# Patient Record
Sex: Female | Born: 1995 | Race: White | Hispanic: No | Marital: Married | Smoking: Never smoker
Health system: Southern US, Community
[De-identification: ages and names within clinical notes are randomized; demographics above are authoritative.]

## PROBLEM LIST (undated history)

## (undated) DIAGNOSIS — J45909 Unspecified asthma, uncomplicated: Secondary | ICD-10-CM

## (undated) DIAGNOSIS — K4090 Unilateral inguinal hernia, without obstruction or gangrene, not specified as recurrent: Secondary | ICD-10-CM

## (undated) HISTORY — PX: WISDOM TOOTH EXTRACTION: SHX21

## (undated) HISTORY — PX: TONSILLECTOMY: SUR1361

## (undated) HISTORY — PX: FRACTURE SURGERY: SHX138

---
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
## 2014-06-02 ENCOUNTER — Ambulatory Visit: Payer: Self-pay | Admitting: Obstetrics and Gynecology

## 2014-06-02 LAB — CBC WITH DIFFERENTIAL/PLATELET
BASOS PCT: 0.1 %
Basophil #: 0 10*3/uL (ref 0.0–0.1)
EOS ABS: 0.1 10*3/uL (ref 0.0–0.7)
Eosinophil %: 1 %
HCT: 40.4 % (ref 35.0–47.0)
HGB: 13.2 g/dL (ref 12.0–16.0)
LYMPHS PCT: 15.8 %
Lymphocyte #: 1.6 10*3/uL (ref 1.0–3.6)
MCH: 28.5 pg (ref 26.0–34.0)
MCHC: 32.6 g/dL (ref 32.0–36.0)
MCV: 88 fL (ref 80–100)
MONO ABS: 0.6 x10 3/mm (ref 0.2–0.9)
MONOS PCT: 6.4 %
Neutrophil #: 7.7 10*3/uL — ABNORMAL HIGH (ref 1.4–6.5)
Neutrophil %: 76.7 %
PLATELETS: 210 10*3/uL (ref 150–440)
RBC: 4.62 10*6/uL (ref 3.80–5.20)
RDW: 13.7 % (ref 11.5–14.5)
WBC: 10 10*3/uL (ref 3.6–11.0)

## 2014-06-05 ENCOUNTER — Inpatient Hospital Stay: Payer: Self-pay | Admitting: Obstetrics and Gynecology

## 2014-06-05 LAB — GC/CHLAMYDIA PROBE AMP

## 2014-06-06 LAB — HEMATOCRIT: HCT: 31.8 % — ABNORMAL LOW (ref 35.0–47.0)

## 2014-09-04 ENCOUNTER — Emergency Department: Payer: Self-pay | Admitting: Emergency Medicine

## 2014-09-28 ENCOUNTER — Emergency Department: Payer: Self-pay | Admitting: Emergency Medicine

## 2014-09-29 ENCOUNTER — Emergency Department: Payer: Self-pay | Admitting: Emergency Medicine

## 2014-09-29 LAB — COMPREHENSIVE METABOLIC PANEL
Albumin: 4 g/dL (ref 3.8–5.6)
Alkaline Phosphatase: 93 U/L
Anion Gap: 8 (ref 7–16)
BUN: 17 mg/dL (ref 7–18)
Bilirubin,Total: 0.4 mg/dL (ref 0.2–1.0)
CHLORIDE: 108 mmol/L — AB (ref 98–107)
Calcium, Total: 8.7 mg/dL — ABNORMAL LOW (ref 9.0–10.7)
Co2: 24 mmol/L (ref 21–32)
Creatinine: 0.71 mg/dL (ref 0.60–1.30)
Glucose: 80 mg/dL (ref 65–99)
Osmolality: 280 (ref 275–301)
POTASSIUM: 4.8 mmol/L (ref 3.5–5.1)
SGOT(AST): 37 U/L — ABNORMAL HIGH (ref 0–26)
SGPT (ALT): 37 U/L
Sodium: 140 mmol/L (ref 136–145)
TOTAL PROTEIN: 7.8 g/dL (ref 6.4–8.6)

## 2014-09-29 LAB — CBC WITH DIFFERENTIAL/PLATELET
BASOS ABS: 0 10*3/uL (ref 0.0–0.1)
Basophil %: 0.2 %
EOS ABS: 0.2 10*3/uL (ref 0.0–0.7)
Eosinophil %: 2.1 %
HCT: 39.7 % (ref 35.0–47.0)
HGB: 13.3 g/dL (ref 12.0–16.0)
LYMPHS ABS: 1.9 10*3/uL (ref 1.0–3.6)
Lymphocyte %: 22.9 %
MCH: 27.9 pg (ref 26.0–34.0)
MCHC: 33.5 g/dL (ref 32.0–36.0)
MCV: 83 fL (ref 80–100)
Monocyte #: 0.8 x10 3/mm (ref 0.2–0.9)
Monocyte %: 9.7 %
NEUTROS ABS: 5.5 10*3/uL (ref 1.4–6.5)
NEUTROS PCT: 65.1 %
Platelet: 231 10*3/uL (ref 150–440)
RBC: 4.76 10*6/uL (ref 3.80–5.20)
RDW: 14.4 % (ref 11.5–14.5)
WBC: 8.4 10*3/uL (ref 3.6–11.0)

## 2014-09-29 LAB — URINALYSIS, COMPLETE
Bilirubin,UR: NEGATIVE
GLUCOSE, UR: NEGATIVE mg/dL (ref 0–75)
Ketone: NEGATIVE
Leukocyte Esterase: NEGATIVE
Nitrite: NEGATIVE
Ph: 6 (ref 4.5–8.0)
RBC,UR: <1 /HPF (ref 0–5)
Specific Gravity: 1.009 (ref 1.003–1.030)

## 2015-03-03 NOTE — Op Note (Signed)
PATIENT NAME:  Vanessa Mullins, Vanessa Mullins MR#:  [id number] DATE OF BIRTH:  [birth year]  DATE OF PROCEDURE:  06/05/2014  PREOPERATIVE DIAGNOSES:  1.  39 weeks estimated gestational age.  2.  Breech presentation.   POSTOPERATIVE DIAGNOSES:  1.  39 weeks estimated gestational age.  2.  Breech presentation.   PROCEDURE: Primary low transverse cesarean section.   ANESTHESIA: Spinal.   INDICATION: This is a 19 year old gravida 1 para zero patient with a known breech presentation, reconfirmed on the day of the procedure. The patient was elected for surgical intervention.   PROCEDURE: After adequate spinal anesthesia, the patient placed in the dorsal supine position with a hip roll under the right side. The patient's abdomen was prepped and draped in normal sterile fashion. The patient did receive 2 grams of IV Ancef prior to commencement of the case.   A Pfannenstiel incision was made 2 fingerbreadths above the symphysis pubis. Sharp dissection was used to identify the fascia. The fascia was opened in the midline and opened in transverse fashion. The superior aspect of the fascia was grasped with Coker clamps and the rectus muscles were dissected free. The inferior aspect of the fascia was grasped with Kocher clamps. Pyramidalis muscle was dissected free. Entry into the peritoneal cavity was accomplished sharply. The vesicouterine peritoneal fold was identified and a bladder flap was created and the bladder was reflected inferiorly.   A low transverse uterine incision was made. Upon entry into the endometrial cavity, clear fluid resulted. The uterine incision was extended with blunt transverse traction. Frank breech presentation was identified.  Legs were delivered and draped with a cloth while the arms were delivered with a sweeping motion to the medial aspect of the infant's thorax and the head was delivered via the Mauriceau-Smellie-Veit maneuver with no difficulty; a somewhat floppy infant. The cord was doubly  clamped, infant passed to neonatal staff who assigned Apgar scores of 9 and 9. Cord blood was obtained. The placenta was manually delivered and the uterus was exteriorized and wiped clean with laparotomy tape.   The cervix was opened with ring forceps and this was passed off the operative field. The uterine incision was closed with 1-chromic suture in a running locking fashion with good approximation and good hemostasis noted. Of note, the uterus was irregularly shaped, most likely due to the prolonged frank presentation with a larger right aspect of the uterus; this is where the head with was antenatally. Fallopian tubes appeared normal. Ovaries appeared normal.   The posterior cul-de-sac was irrigated and suctioned and the uterus was placed back into the abdominal cavity.  The paracolic gutters were wiped clean with laparotomy tape. Uterine incision again appeared hemostatic. The superior aspect of the fascia was grasped with Coker clamps and the On-Q pump catheters were placed from an infraumbilical position to a subfascial position. Interceed was placed on the uterine incision in a T-shaped fashion and the fascia was then closed above the On-Q pump catheters with 0-Vicryl suture. Good hemostasis was noted. Good approximation of tissues.   Subcutaneous tissues were irrigated and Bovied and the skin was reapproximated with the Insorb absorbable sutures with good cosmetic effect. The On-Q pump catheters were secured at the skin level with Dermabond, Steri-Strips and Tegaderm. Each catheter was loaded with 5 mL of 0.5% Marcaine.   ESTIMATED BLOOD LOSS:  500 mL   INTRAOPERATIVE FLUIDS: 1100 mL.  URINE OUTPUT: 150 mL.  The patient tolerated the procedure well and was taken to the recovery room in good  condition.    ____________________________ Suzy Bouchard, MD tjs:lt D: 06/05/2014 10:14:53 ET T: 06/05/2014 10:31:31 ET JOB#: [id number]  cc: Suzy Bouchard, MD, <Dictator> Suzy Bouchard MD ELECTRONICALLY SIGNED 06/05/2014 17:02

## 2016-03-31 ENCOUNTER — Encounter: Payer: Self-pay | Admitting: Emergency Medicine

## 2016-03-31 ENCOUNTER — Emergency Department
Admission: EM | Admit: 2016-03-31 | Discharge: 2016-03-31 | Disposition: A | Discharge status: Home / Self Care | Payer: Medicaid Other | Attending: Emergency Medicine | Admitting: Emergency Medicine

## 2016-03-31 ENCOUNTER — Emergency Department: Payer: Medicaid Other

## 2016-03-31 DIAGNOSIS — J45909 Unspecified asthma, uncomplicated: Secondary | ICD-10-CM | POA: Insufficient documentation

## 2016-03-31 DIAGNOSIS — R112 Nausea with vomiting, unspecified: Secondary | ICD-10-CM | POA: Insufficient documentation

## 2016-03-31 DIAGNOSIS — R1032 Left lower quadrant pain: Secondary | ICD-10-CM | POA: Diagnosis present

## 2016-03-31 DIAGNOSIS — R1030 Lower abdominal pain, unspecified: Secondary | ICD-10-CM

## 2016-03-31 HISTORY — DX: Unilateral inguinal hernia, without obstruction or gangrene, not specified as recurrent: K40.90

## 2016-03-31 HISTORY — DX: Unspecified asthma, uncomplicated: J45.909

## 2016-03-31 LAB — URINALYSIS COMPLETE WITH MICROSCOPIC (ARMC ONLY)
BACTERIA UA: NONE SEEN
BILIRUBIN URINE: NEGATIVE
GLUCOSE, UA: NEGATIVE mg/dL
HGB URINE DIPSTICK: NEGATIVE
Ketones, ur: NEGATIVE mg/dL
LEUKOCYTES UA: NEGATIVE
Nitrite: NEGATIVE
Protein, ur: NEGATIVE mg/dL
Specific Gravity, Urine: 1.012 (ref 1.005–1.030)
pH: 6 (ref 5.0–8.0)

## 2016-03-31 LAB — COMPREHENSIVE METABOLIC PANEL
ALBUMIN: 4.6 g/dL (ref 3.5–5.0)
ALT: 25 U/L (ref 14–54)
AST: 26 U/L (ref 15–41)
Alkaline Phosphatase: 74 U/L (ref 38–126)
Anion gap: 6 (ref 5–15)
BILIRUBIN TOTAL: 0.5 mg/dL (ref 0.3–1.2)
BUN: 13 mg/dL (ref 6–20)
CHLORIDE: 103 mmol/L (ref 101–111)
CO2: 27 mmol/L (ref 22–32)
CREATININE: 0.67 mg/dL (ref 0.44–1.00)
Calcium: 9.1 mg/dL (ref 8.9–10.3)
GFR calc Af Amer: >60 mL/min (ref >60)
GLUCOSE: 78 mg/dL (ref 65–99)
Potassium: 3.9 mmol/L (ref 3.5–5.1)
Sodium: 136 mmol/L (ref 135–145)
Total Protein: 8.1 g/dL (ref 6.5–8.1)

## 2016-03-31 LAB — POCT PREGNANCY, URINE: PREG TEST UR: NEGATIVE

## 2016-03-31 LAB — CBC
HEMATOCRIT: 40 % (ref 35.0–47.0)
Hemoglobin: 13.9 g/dL (ref 12.0–16.0)
MCH: 28.5 pg (ref 26.0–34.0)
MCHC: 34.7 g/dL (ref 32.0–36.0)
MCV: 82.3 fL (ref 80.0–100.0)
PLATELETS: 239 10*3/uL (ref 150–440)
RBC: 4.86 MIL/uL (ref 3.80–5.20)
RDW: 13.2 % (ref 11.5–14.5)
WBC: 9.1 10*3/uL (ref 3.6–11.0)

## 2016-03-31 MED ORDER — ONDANSETRON HCL 4 MG/2ML IJ SOLN
INTRAMUSCULAR | Status: AC
  Administered 2016-03-31: 4 mg via INTRAVENOUS
  Filled 2016-03-31: qty 2

## 2016-03-31 MED ORDER — IOPAMIDOL (ISOVUE-300) INJECTION 61%
100.0000 mL | Freq: Once | INTRAVENOUS | Status: AC | PRN
  Administered 2016-03-31: 100 mL via INTRAVENOUS
  Filled 2016-03-31: qty 100

## 2016-03-31 MED ORDER — DIATRIZOATE MEGLUMINE & SODIUM 66-10 % PO SOLN
15.0000 mL | Freq: Once | ORAL | Status: AC
  Administered 2016-03-31: 15 mL via ORAL

## 2016-03-31 MED ORDER — MORPHINE SULFATE (PF) 4 MG/ML IV SOLN
4.0000 mg | Freq: Once | INTRAVENOUS | Status: AC
Start: 2016-03-31 — End: 2016-03-31
  Administered 2016-03-31: 4 mg via INTRAVENOUS

## 2016-03-31 MED ORDER — ONDANSETRON HCL 4 MG/2ML IJ SOLN
4.0000 mg | Freq: Once | INTRAMUSCULAR | Status: AC
  Administered 2016-03-31: 4 mg via INTRAVENOUS

## 2016-03-31 MED ORDER — MORPHINE SULFATE (PF) 4 MG/ML IV SOLN
INTRAVENOUS | Status: AC
  Administered 2016-03-31: 4 mg via INTRAVENOUS
  Filled 2016-03-31: qty 1

## 2016-03-31 MED ORDER — TRAMADOL HCL 50 MG PO TABS: 50.0000 mg | ORAL_TABLET | Freq: Four times a day (QID) | ORAL | Status: AC | PRN

## 2016-03-31 NOTE — Discharge Instructions (Signed)
You have been seen in the emergency department today for lower abdominal pain. Your workup is consistent with scar tissue in your cesarean section incision area. Please follow-up with your OB/GYN for further evaluation. Please take your pain medication as needed, as prescribed. Return to the emergency department for any worsening pain, or any other symptom personally concerning to yourself.   Abdominal Pain, Adult Many things can cause abdominal pain. Usually, abdominal pain is not caused by a disease and will improve without treatment. It can often be observed and treated at home. Your health care provider will do a physical exam and possibly order blood tests and X-rays to help determine the seriousness of your pain. However, in many cases, more time must pass before a clear cause of the pain can be found. Before that point, your health care provider may not know if you need more testing or further treatment. HOME CARE INSTRUCTIONS Monitor your abdominal pain for any changes. The following actions may help to alleviate any discomfort you are experiencing:  Only take over-the-counter or prescription medicines as directed by your health care provider.  Do not take laxatives unless directed to do so by your health care provider.  Try a clear liquid diet (broth, tea, or water) as directed by your health care provider. Slowly move to a bland diet as tolerated. SEEK MEDICAL CARE IF:  You have unexplained abdominal pain.  You have abdominal pain associated with nausea or diarrhea.  You have pain when you urinate or have a bowel movement.  You experience abdominal pain that wakes you in the night.  You have abdominal pain that is worsened or improved by eating food.  You have abdominal pain that is worsened with eating fatty foods.  You have a fever. SEEK IMMEDIATE MEDICAL CARE IF:  Your pain does not go away within 2 hours.  You keep throwing up (vomiting).  Your pain is felt only in  portions of the abdomen, such as the right side or the left lower portion of the abdomen.  You pass bloody or black tarry stools. MAKE SURE YOU:  Understand these instructions.  Will watch your condition.  Will get help right away if you are not doing well or get worse.   This information is not intended to replace advice given to you by your health care provider. Make sure you discuss any questions you have with your health care provider.   Document Released: 08/06/2005 Document Revised: 07/18/2015 Document Reviewed: 07/06/2013 Elsevier Interactive Patient Education [employer]2016 [employer]

## 2016-03-31 NOTE — ED Notes (Signed)
MD made aware pt in pain and requesting pain meds.

## 2016-03-31 NOTE — ED Notes (Signed)
Patient transported to CT 

## 2016-03-31 NOTE — ED Notes (Signed)
Lower left abdominal pain increasing today. Has history of being told has inguinal hernia on that side. No hard lump felt.

## 2016-03-31 NOTE — ED Notes (Signed)

## 2016-03-31 NOTE — ED Provider Notes (Signed)
[facility] Emergency Department Provider Note  Time seen: 7:32 PM  I have reviewed the triage vital signs and the nursing notes.   HISTORY  Chief Complaint Abdominal Pain    HPI Vanessa Mullins is a 20 y.o. female with no past medical history who presents the emergency department left lower quadrant abdominal pain. According to the patient for the past 1.5 months she's been experiencing fairly constant left lower quadrant abdominal pain. She states it is worse over the past several days and when she is having pain lifting HER-40-year-old. States the pain is worse with movement especially lifting movements. Denies any dysuria, vaginal discharge or bleeding. Patient has been told she may have an inguinal hernia on that side, but states she has never had imaging to support this. States normal bowel movements including yesterday. Denies diarrhea or constipation. Patient describes the abdominal pain as mild constant dull pain with occasional moderate to severe sharp pains especially with movement and lifting.     Past Medical History  Diagnosis Date  . Inguinal hernia     There are no active problems to display for this patient.   Past Surgical History  Procedure Laterality Date  . Cesarean section      No current outpatient prescriptions on file.  Allergies Review of patient's allergies indicates no known allergies.  No family history on file.  Social History Social History  Substance Use Topics  . Smoking status: Never Smoker   . Smokeless tobacco: None  . Alcohol Use: No    Review of Systems Constitutional: Negative for fever. Cardiovascular: Negative for chest pain. Respiratory: Negative for shortness of breath. Gastrointestinal: Left lower quadrant abdominal pain. Positive for nausea and vomiting. Negative for diarrhea. Genitourinary: Negative for dysuria. Negative for vaginal bleeding or discharge. Musculoskeletal: Negative for back  pain. Neurological: Negative for headache 10-point ROS otherwise negative.  ____________________________________________   PHYSICAL EXAM:  VITAL SIGNS: ED Triage Vitals  Enc Vitals Group     BP 03/31/16 1703 122/75 mmHg     Pulse Rate 03/31/16 1703 79     Resp 03/31/16 1703 20     Temp 03/31/16 1703 98 F (36.7 C)     Temp Source 03/31/16 1703 Oral     SpO2 03/31/16 1703 99 %     Weight 03/31/16 1703 190 lb (86.183 kg)     Height 03/31/16 1703 _0  (1.651 m)     Head Cir --      Peak Flow --      Pain Score 03/31/16 1704 9     Pain Loc --      Pain Edu? --      Excl. in [place]? --     Constitutional: Alert and oriented. Well appearing and in no distress. Eyes: Normal exam ENT   Head: Normocephalic and atraumatic   Mouth/Throat: Mucous membranes are moist. Cardiovascular: Normal rate, regular rhythm. No murmur Respiratory: Normal respiratory effort without tachypnea nor retractions. Breath sounds are clear  Gastrointestinal: Soft, minimal left lower quadrant abdominal tenderness to palpation. No rebound or guarding. No distention. No reaction to deep palpation. No hernia palpated. Musculoskeletal: Nontender with normal range of motion in all extremities.  Neurologic:  Normal speech and language. No gross focal neurologic deficits  Skin:  Skin is warm, dry and intact.  Psychiatric: Mood and affect are normal.  ____________________________________________   RADIOLOGY  Ultrasound negative CT negative ____________________________________________    INITIAL IMPRESSION / ASSESSMENT AND PLAN / ED COURSE  Pertinent labs & imaging results that were available during my care of the patient were reviewed by me and considered in my medical decision making (see chart for details).  Overall very well-appearing patient who presents with left lower quadrant abdominal pain for the past 1.5 months worse over the past few days. Patient has a very benign abdominal exam with  minimal left lower quadrant abdominal tenderness, no mass palpated, no rebound or guarding. Labs are largely within normal limits. We'll proceed with a pelvic ultrasound to further evaluate.  Ultrasound negative. Labs are within normal limits. Patient states she continues to have pain in this area and really feels that it is a hernia causing her pain. The proceed with a CT abdomen/pelvis to further evaluate. I discussed the risk and benefits of CT, patient is agreeable.  CT is negative. I discussed with the radiologist given the patient's pain location, he states there could be a small foci approximately 7 mm of scar tissue on her C-section scar, this does correlate to the area that the patient is tender. We will discharge the patient on a short course of Ultram, and have her follow up with her gynecologist at [facility] clinic. Patient agreeable.  ____________________________________________   FINAL CLINICAL IMPRESSION(S) / ED DIAGNOSES  Left lower quadrant abdominal pain   Harvest Dark, MD 03/31/16 2201

## 2017-01-30 DIAGNOSIS — N6310 Unspecified lump in the right breast, unspecified quadrant: Secondary | ICD-10-CM | POA: Diagnosis not present

## 2017-02-02 ENCOUNTER — Other Ambulatory Visit: Payer: Self-pay | Admitting: Family Medicine

## 2017-02-02 DIAGNOSIS — N6310 Unspecified lump in the right breast, unspecified quadrant: Secondary | ICD-10-CM

## 2017-02-04 ENCOUNTER — Ambulatory Visit
Admission: RE | Admit: 2017-02-04 | Discharge: 2017-02-04 | Disposition: A | Discharge status: Home / Self Care | Payer: 59 | Source: Ambulatory Visit | Attending: Family Medicine | Admitting: Family Medicine

## 2017-02-04 DIAGNOSIS — R928 Other abnormal and inconclusive findings on diagnostic imaging of breast: Secondary | ICD-10-CM | POA: Insufficient documentation

## 2017-02-04 DIAGNOSIS — N6489 Other specified disorders of breast: Secondary | ICD-10-CM | POA: Diagnosis not present

## 2017-02-04 DIAGNOSIS — N6310 Unspecified lump in the right breast, unspecified quadrant: Secondary | ICD-10-CM

## 2017-02-25 DIAGNOSIS — R234 Changes in skin texture: Secondary | ICD-10-CM | POA: Diagnosis not present

## 2017-02-25 DIAGNOSIS — J309 Allergic rhinitis, unspecified: Secondary | ICD-10-CM | POA: Diagnosis not present

## 2017-03-12 DIAGNOSIS — Z124 Encounter for screening for malignant neoplasm of cervix: Secondary | ICD-10-CM | POA: Diagnosis not present

## 2017-03-12 DIAGNOSIS — Z0000 Encounter for general adult medical examination without abnormal findings: Secondary | ICD-10-CM | POA: Diagnosis not present

## 2017-04-10 DIAGNOSIS — Z79899 Other long term (current) drug therapy: Secondary | ICD-10-CM | POA: Diagnosis not present

## 2017-07-01 DIAGNOSIS — B9689 Other specified bacterial agents as the cause of diseases classified elsewhere: Secondary | ICD-10-CM | POA: Diagnosis not present

## 2017-07-01 DIAGNOSIS — N760 Acute vaginitis: Secondary | ICD-10-CM | POA: Diagnosis not present

## 2017-07-01 DIAGNOSIS — L298 Other pruritus: Secondary | ICD-10-CM | POA: Diagnosis not present

## 2017-08-10 DIAGNOSIS — G589 Mononeuropathy, unspecified: Secondary | ICD-10-CM | POA: Diagnosis not present

## 2017-08-14 DIAGNOSIS — R197 Diarrhea, unspecified: Secondary | ICD-10-CM | POA: Diagnosis not present

## 2017-08-14 DIAGNOSIS — R1084 Generalized abdominal pain: Secondary | ICD-10-CM | POA: Diagnosis not present

## 2017-08-14 DIAGNOSIS — R112 Nausea with vomiting, unspecified: Secondary | ICD-10-CM | POA: Diagnosis not present

## 2017-08-27 DIAGNOSIS — R5383 Other fatigue: Secondary | ICD-10-CM | POA: Diagnosis not present

## 2017-08-27 DIAGNOSIS — N6452 Nipple discharge: Secondary | ICD-10-CM | POA: Diagnosis not present

## 2017-08-27 DIAGNOSIS — N63 Unspecified lump in unspecified breast: Secondary | ICD-10-CM | POA: Diagnosis not present

## 2017-08-31 ENCOUNTER — Other Ambulatory Visit: Payer: Self-pay | Admitting: Student

## 2017-08-31 DIAGNOSIS — E221 Hyperprolactinemia: Secondary | ICD-10-CM

## 2017-09-04 ENCOUNTER — Encounter: Payer: Self-pay | Admitting: Radiology

## 2017-09-04 ENCOUNTER — Ambulatory Visit
Admission: RE | Admit: 2017-09-04 | Discharge: 2017-09-04 | Disposition: A | Discharge status: Home / Self Care | Payer: Commercial Managed Care - HMO | Source: Ambulatory Visit | Attending: Student | Admitting: Student

## 2017-09-04 DIAGNOSIS — E221 Hyperprolactinemia: Secondary | ICD-10-CM | POA: Diagnosis not present

## 2017-09-04 DIAGNOSIS — R51 Headache: Secondary | ICD-10-CM | POA: Diagnosis not present

## 2017-09-04 DIAGNOSIS — R42 Dizziness and giddiness: Secondary | ICD-10-CM | POA: Diagnosis not present

## 2017-09-04 MED ORDER — GADOBENATE DIMEGLUMINE 529 MG/ML IV SOLN
10.0000 mL | Freq: Once | INTRAVENOUS | Status: AC | PRN
  Administered 2017-09-04: 9 mL via INTRAVENOUS

## 2017-09-09 DIAGNOSIS — G589 Mononeuropathy, unspecified: Secondary | ICD-10-CM | POA: Diagnosis not present

## 2017-09-11 DIAGNOSIS — N643 Galactorrhea not associated with childbirth: Secondary | ICD-10-CM | POA: Diagnosis not present

## 2017-10-13 DIAGNOSIS — E221 Hyperprolactinemia: Secondary | ICD-10-CM | POA: Diagnosis not present

## 2017-10-13 DIAGNOSIS — R6889 Other general symptoms and signs: Secondary | ICD-10-CM | POA: Diagnosis not present

## 2017-10-22 ENCOUNTER — Ambulatory Visit: Payer: 59 | Admitting: Dietician

## 2017-10-29 ENCOUNTER — Encounter: Payer: Self-pay | Admitting: Dietician

## 2017-12-03 DIAGNOSIS — G589 Mononeuropathy, unspecified: Secondary | ICD-10-CM | POA: Diagnosis not present

## 2017-12-06 ENCOUNTER — Emergency Department
Admission: EM | Admit: 2017-12-06 | Discharge: 2017-12-06 | Disposition: A | Discharge status: Home / Self Care | Payer: 59 | Attending: Emergency Medicine | Admitting: Emergency Medicine

## 2017-12-06 ENCOUNTER — Encounter: Payer: Self-pay | Admitting: Emergency Medicine

## 2017-12-06 ENCOUNTER — Emergency Department: Payer: 59

## 2017-12-06 ENCOUNTER — Other Ambulatory Visit: Payer: Self-pay

## 2017-12-06 DIAGNOSIS — J45909 Unspecified asthma, uncomplicated: Secondary | ICD-10-CM | POA: Insufficient documentation

## 2017-12-06 DIAGNOSIS — S59801A Other specified injuries of right elbow, initial encounter: Secondary | ICD-10-CM | POA: Diagnosis not present

## 2017-12-06 DIAGNOSIS — M7989 Other specified soft tissue disorders: Secondary | ICD-10-CM | POA: Diagnosis not present

## 2017-12-06 DIAGNOSIS — S53401A Unspecified sprain of right elbow, initial encounter: Secondary | ICD-10-CM | POA: Insufficient documentation

## 2017-12-06 DIAGNOSIS — Y929 Unspecified place or not applicable: Secondary | ICD-10-CM | POA: Insufficient documentation

## 2017-12-06 DIAGNOSIS — S59901A Unspecified injury of right elbow, initial encounter: Secondary | ICD-10-CM | POA: Diagnosis not present

## 2017-12-06 DIAGNOSIS — M25521 Pain in right elbow: Secondary | ICD-10-CM | POA: Diagnosis not present

## 2017-12-06 DIAGNOSIS — Y999 Unspecified external cause status: Secondary | ICD-10-CM | POA: Insufficient documentation

## 2017-12-06 DIAGNOSIS — Y9389 Activity, other specified: Secondary | ICD-10-CM | POA: Insufficient documentation

## 2017-12-06 DIAGNOSIS — W06XXXA Fall from bed, initial encounter: Secondary | ICD-10-CM | POA: Diagnosis not present

## 2017-12-06 MED ORDER — HYDROCODONE-ACETAMINOPHEN 5-325 MG PO TABS: 1.0000 | ORAL_TABLET | ORAL | 0 refills | Status: DC | PRN

## 2017-12-06 MED ORDER — HYDROCODONE-ACETAMINOPHEN 5-325 MG PO TABS
1.0000 | ORAL_TABLET | Freq: Once | ORAL | Status: AC
  Administered 2017-12-06: 1 via ORAL
  Filled 2017-12-06: qty 1

## 2017-12-06 NOTE — ED Notes (Signed)
Pt states she felt a popping sensation in elbow when she fell out of bed landed on her right arm. Some swelling noted just below elbow. Pt currently applying ice to the affected area. Strong radial pulse with a cap refill < 3. When extending and bending elbow pt states she feels a pulling sensation above the elbow causing discomfort.

## 2017-12-06 NOTE — ED Triage Notes (Signed)
Pt says she fell out of bed tonight and landed wrong on her right arm; c/o pain to right elbow, forearm; swelling present just below elbow; too painful to completely extend arm; strong radial pulse;

## 2017-12-06 NOTE — ED Notes (Signed)
Right arm sling placed on pt

## 2017-12-06 NOTE — ED Provider Notes (Signed)
[facility] Emergency Department Provider Note  Time seen: 3:54 AM  I have reviewed the triage vital signs and the nursing notes.   HISTORY  Chief Complaint Fall    HPI Vanessa Mullins is a 22 y.o. female with a past medical history of asthma presents to the emergency department with right elbow pain.  According to the patient she was getting out of bed to use the restroom states she is very tired and she tripped and fell.  Does not recall exactly how she landed on the arm but is having significant discomfort in her right elbow with some mild swelling.  Rates her pain as moderate aching pain, worse with movement of the elbow.  No other injuries.  Did not hit head or pass out.  Negative review of systems.   Past Medical History:  Diagnosis Date  . Asthma   . Inguinal hernia     There are no active problems to display for this patient.   Past Surgical History:  Procedure Laterality Date  . CESAREAN SECTION      Prior to Admission medications   Medication Sig Start Date End Date Taking? Authorizing Provider  phentermine 37.5 MG capsule Take 37.5 mg by mouth every morning.   Yes [provider]    Allergies  Allergen Reactions  . Amoxicillin Rash    History reviewed. No pertinent family history.  Social History Social History   Tobacco Use  . Smoking status: Never Smoker  . Smokeless tobacco: Never Used  Substance Use Topics  . Alcohol use: No  . Drug use: No    Review of Systems Constitutional: Negative for loss of consciousness. Cardiovascular: Negative for chest pain. Respiratory: Negative for shortness of breath. Gastrointestinal: Negative for abdominal pain Musculoskeletal: Right elbow pain and swelling Skin: Negative for abrasions or lacerations All other ROS negative  ____________________________________________   PHYSICAL EXAM:  VITAL SIGNS: ED Triage Vitals  Enc Vitals Group     BP 12/06/17 0113 122/81     Pulse Rate 12/06/17 0113 96     Resp 12/06/17 0113 18     Temp 12/06/17 0113 (!) 97.5 F (36.4 C)     Temp Source 12/06/17 0113 Oral     SpO2 12/06/17 0113 97 %     Weight 12/06/17 0115 220 lb (99.8 kg)     Height 12/06/17 0115 5\' 5"  (1.651 m)     Head Circumference --      Peak Flow --      Pain Score 12/06/17 0114 8     Pain Loc --      Pain Edu? --      Excl. in [place]? --    Constitutional: Alert and oriented. Well appearing and in no distress. Eyes: Normal exam ENT   Head: Normocephalic and atraumatic.   Mouth/Throat: Mucous membranes are moist. Cardiovascular: Normal rate, regular rhythm. No murmur Respiratory: Normal respiratory effort without tachypnea nor retractions. Breath sounds are clear  Gastrointestinal: Soft and nontender. No distention.   Musculoskeletal: Moderate tenderness of the right elbow.  Moderate discomfort with range of motion.  Neurovascular intact distally.  Good range of motion of the shoulder and the wrist.  No ecchymosis noted, no abrasions or lacerations.  Mild swelling about the elbow. Neurologic:  Normal speech and language. No gross focal neurologic deficits  Skin:  Skin is warm, dry and intact.  Psychiatric: Mood and affect are normal.   ____________________________________________    EKG  X-ray negative  INITIAL IMPRESSION / ASSESSMENT AND PLAN / ED COURSE  Pertinent labs & imaging results that were available during my care of the patient were reviewed by me and considered in my medical decision making (see chart for details).  Patient presents the emergency department for right elbow pain after a fall.  Differential would include contusion, sprain, fracture, dislocation.  Mild to moderate tenderness on exam, good range of motion but with discomfort.  Neurovascular intact distally.  X-ray is negative.  No apparent fracture or dislocation.  Overall exam most consistent with elbow sprain.  Will place in the sling for the next 3 days.   Discussed with patient if she continues to have discomfort after 3 days she is to follow-up with orthopedics in 1 week for recheck/reevaluation.  Otherwise she is to remove the sling after 3 days.  Patient agreeable to plan.  We will discharge with a short course of pain medication.  ____________________________________________   FINAL CLINICAL IMPRESSION(S) / ED DIAGNOSES  Right elbow sprain    Minna Antis, MD 12/06/17 [phone]

## 2018-04-02 IMAGING — CR DG ELBOW COMPLETE 3+V*R*
5 series · 5 of 5 positions shown · non-contrast
Comparison: Right forearm 06/13/2006

CLINICAL DATA: Patient fell out of bed tonight, landing on the
right arm. Pain in the right elbow and forearm. Swelling.

EXAM:
RIGHT ELBOW - COMPLETE 3+ VIEW

[elbow ap]
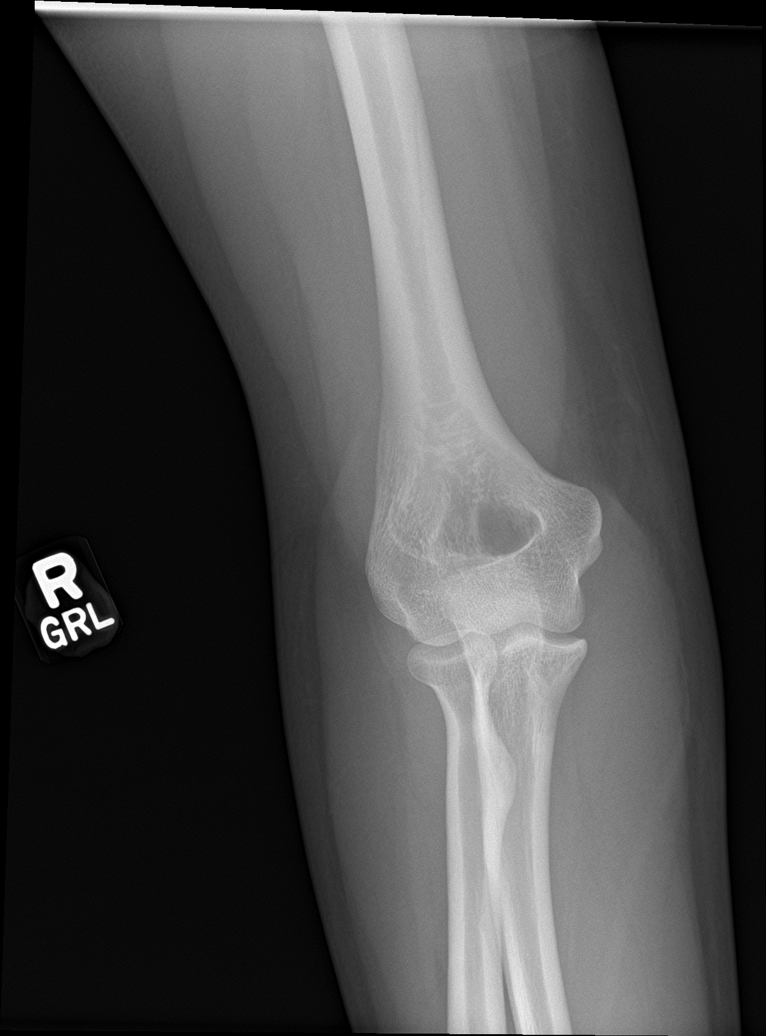

[elbow obl (1 of 2)]
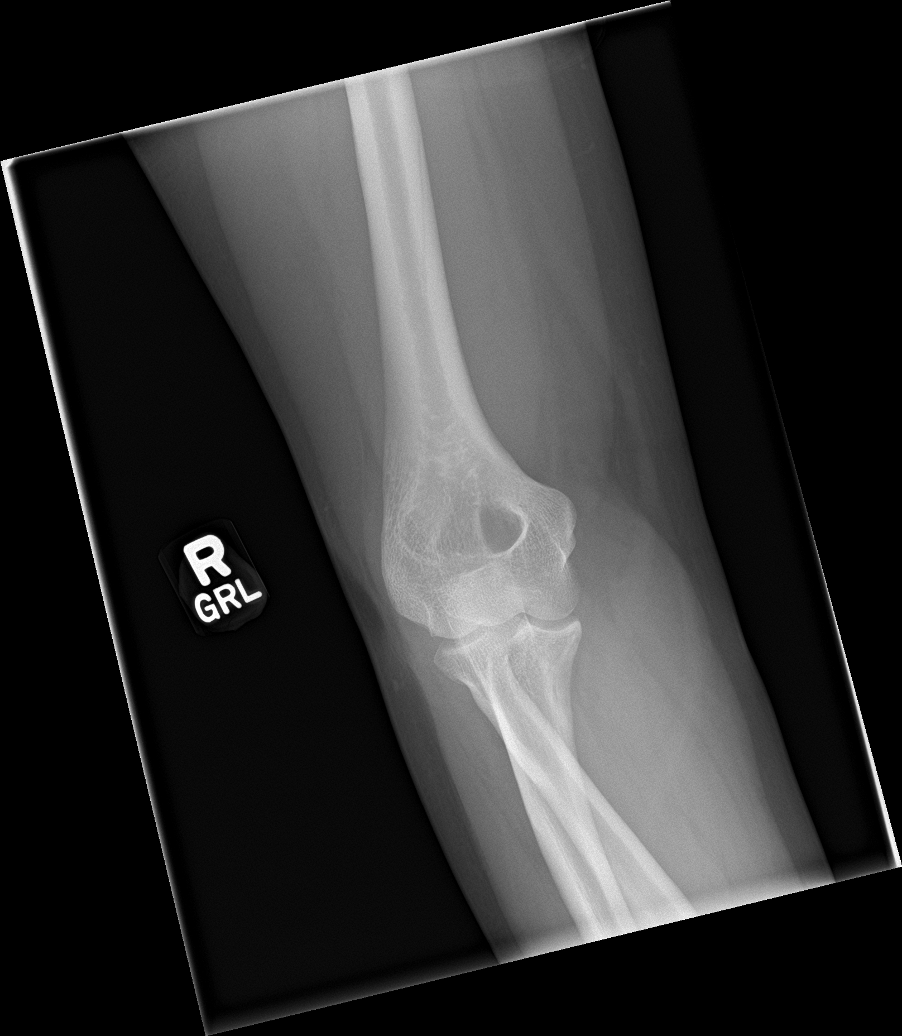

[elbow obl (2 of 2)]
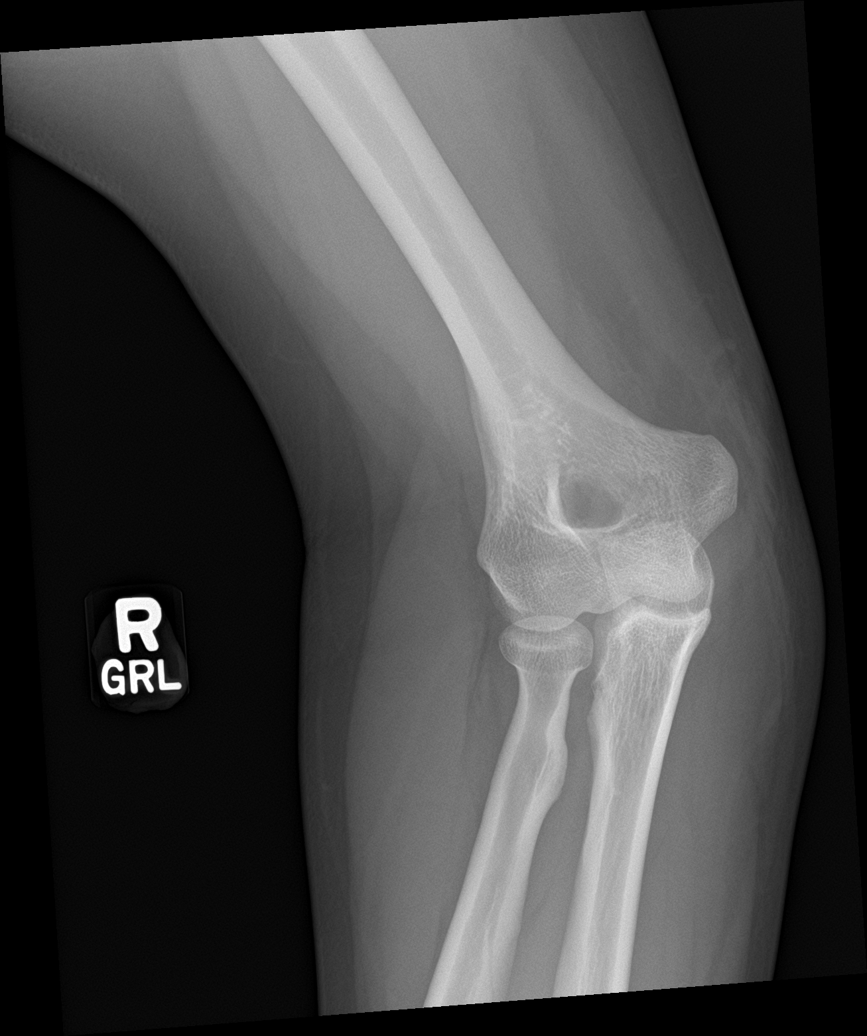

[elbow lat (1 of 2)]
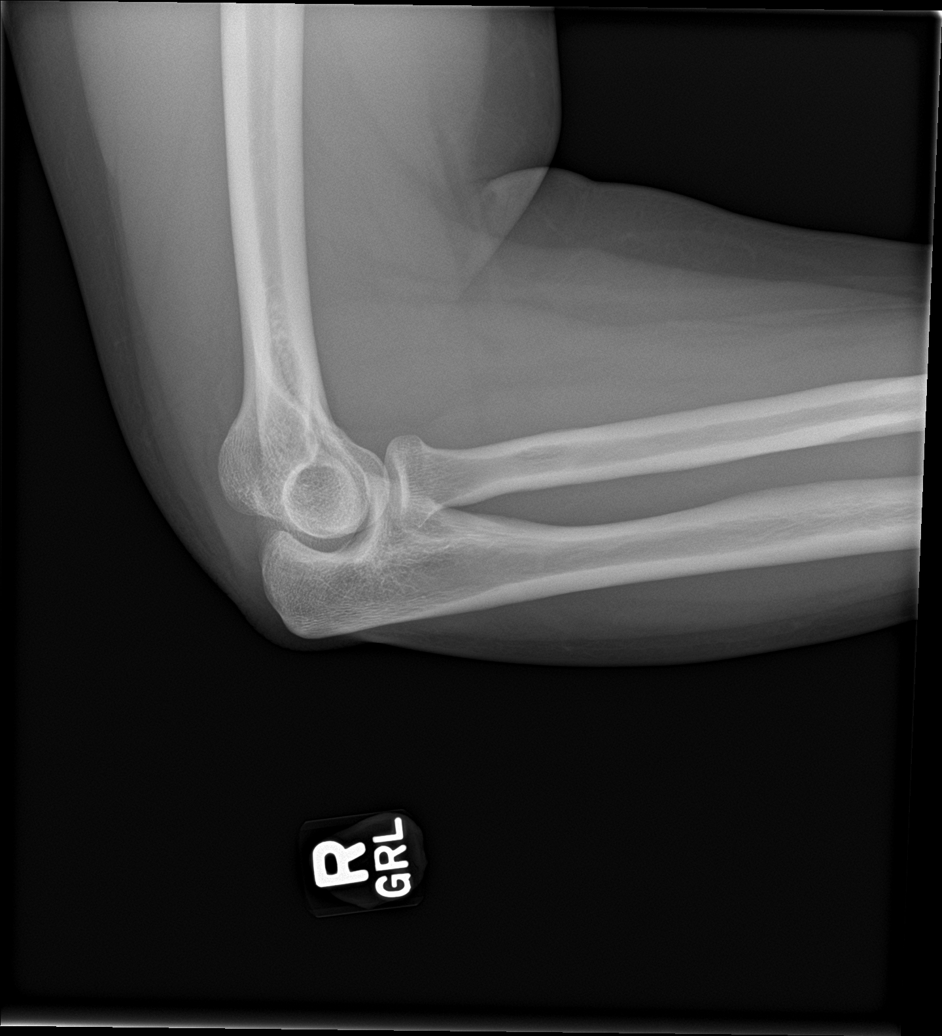

[elbow lat (2 of 2)]
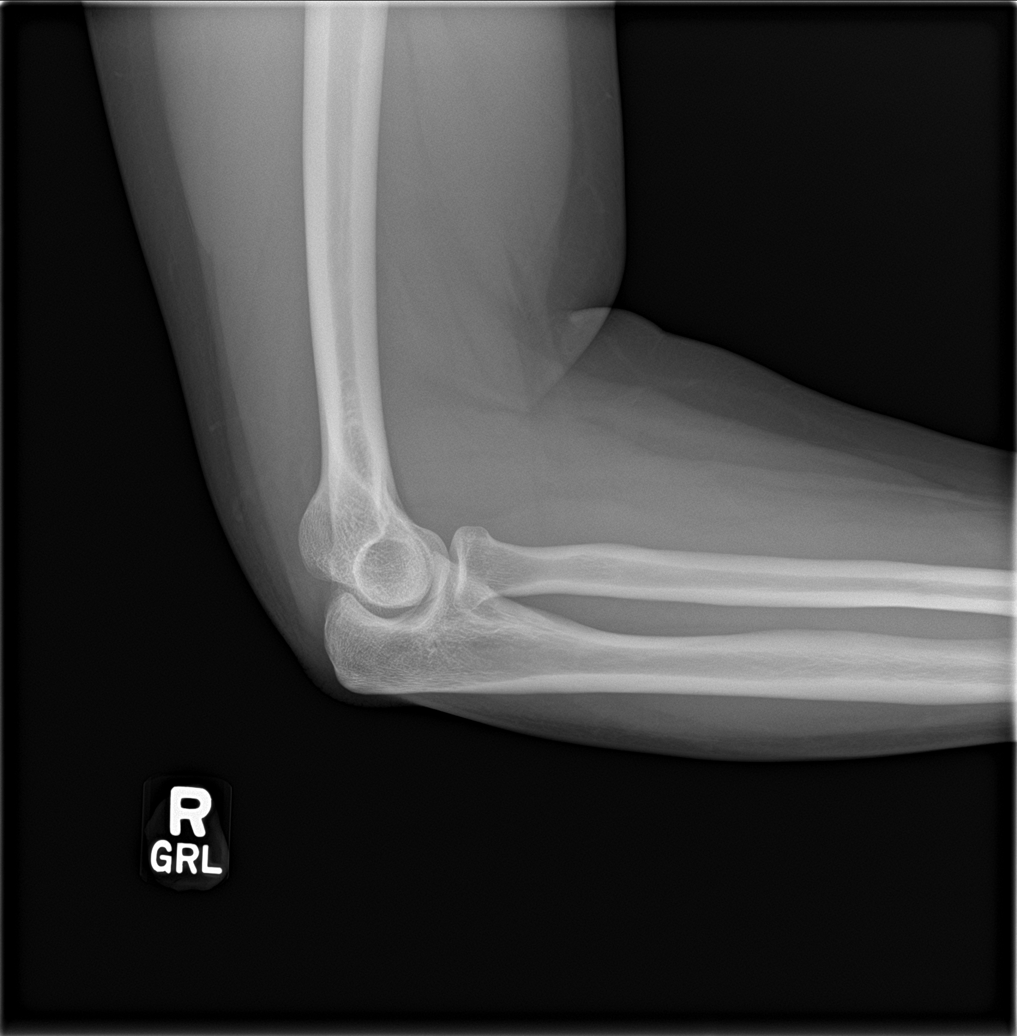

[5 of 5 positions shown; findings below may reference images not displayed]

FINDINGS: There is no evidence of fracture, dislocation, or joint effusion.
There is no evidence of arthropathy or other focal bone abnormality.
Soft tissues are unremarkable.
IMPRESSION: Negative.

## 2018-05-20 DIAGNOSIS — L719 Rosacea, unspecified: Secondary | ICD-10-CM | POA: Diagnosis not present

## 2018-05-20 DIAGNOSIS — L905 Scar conditions and fibrosis of skin: Secondary | ICD-10-CM | POA: Diagnosis not present

## 2018-05-20 DIAGNOSIS — L710 Perioral dermatitis: Secondary | ICD-10-CM | POA: Diagnosis not present

## 2018-05-26 DIAGNOSIS — R5383 Other fatigue: Secondary | ICD-10-CM | POA: Diagnosis not present

## 2018-05-26 DIAGNOSIS — R590 Localized enlarged lymph nodes: Secondary | ICD-10-CM | POA: Diagnosis not present

## 2018-08-09 DIAGNOSIS — R109 Unspecified abdominal pain: Secondary | ICD-10-CM | POA: Diagnosis not present

## 2018-08-09 DIAGNOSIS — N63 Unspecified lump in unspecified breast: Secondary | ICD-10-CM | POA: Diagnosis not present

## 2018-08-09 DIAGNOSIS — N898 Other specified noninflammatory disorders of vagina: Secondary | ICD-10-CM | POA: Diagnosis not present

## 2018-09-14 DIAGNOSIS — N6452 Nipple discharge: Secondary | ICD-10-CM | POA: Diagnosis not present

## 2018-09-14 DIAGNOSIS — Z23 Encounter for immunization: Secondary | ICD-10-CM | POA: Diagnosis not present

## 2020-05-07 ENCOUNTER — Telehealth: Payer: Self-pay | Admitting: Certified Nurse Midwife

## 2020-05-07 NOTE — Telephone Encounter (Signed)
Called patient to let her know that I was able to get her scheduled with one of our midwives. Called patient to set up appointment was unable to reach patient., left message for her to call the office.

## 2020-05-21 ENCOUNTER — Encounter: Payer: Self-pay | Admitting: Certified Nurse Midwife

## 2020-05-21 ENCOUNTER — Other Ambulatory Visit: Payer: Self-pay

## 2020-05-21 ENCOUNTER — Ambulatory Visit: Payer: 59 | Admitting: Certified Nurse Midwife

## 2020-05-21 VITALS — BP 115/80 | HR 86 | Ht 65.0 in | Wt 227.6 lb

## 2020-05-21 DIAGNOSIS — O26899 Other specified pregnancy related conditions, unspecified trimester: Secondary | ICD-10-CM | POA: Diagnosis not present

## 2020-05-21 DIAGNOSIS — Z3201 Encounter for pregnancy test, result positive: Secondary | ICD-10-CM | POA: Diagnosis not present

## 2020-05-21 DIAGNOSIS — R109 Unspecified abdominal pain: Secondary | ICD-10-CM

## 2020-05-21 DIAGNOSIS — O34219 Maternal care for unspecified type scar from previous cesarean delivery: Secondary | ICD-10-CM

## 2020-05-21 DIAGNOSIS — N926 Irregular menstruation, unspecified: Secondary | ICD-10-CM

## 2020-05-21 LAB — POCT URINE PREGNANCY: Preg Test, Ur: POSITIVE — AB

## 2020-05-21 NOTE — Patient Instructions (Addendum)
Eating Plan for Pregnant Women While you are pregnant, your body requires additional nutrition to help support your growing baby. You also have a higher need for some vitamins and minerals, such as folic acid, calcium, iron, and vitamin D. Eating a healthy, well-balanced diet is very important for your health and your baby's health. Your need for extra calories varies for the three 80-monthsegments of your pregnancy (trimesters). For most women, it is recommended to consume:  150 extra calories a day during the first trimester.  300 extra calories a day during the second trimester.  300 extra calories a day during the third trimester. What are tips for following this plan?   Do not try to lose weight or go on a diet during pregnancy.  Limit your overall intake of foods that have "empty calories." These are foods that have little nutritional value, such as sweets, desserts, candies, and sugar-sweetened beverages.  Eat a variety of foods (especially fruits and vegetables) to get a full range of vitamins and minerals.  Take a prenatal vitamin to help meet your additional vitamin and mineral needs during pregnancy, specifically for folic acid, iron, calcium, and vitamin D.  Remember to stay active. Ask your health care provider what types of exercise and activities are safe for you.  Practice good food safety and cleanliness. Wash your hands before you eat and after you prepare raw meat. Wash all fruits and vegetables well before peeling or eating. Taking these actions can help to prevent food-borne illnesses that can be very dangerous to your baby, such as listeriosis. Ask your health care provider for more information about listeriosis. What does 150 extra calories look like? Healthy options that provide 150 extra calories each day could be any of the following:  6-8 oz (170-230 g) of plain low-fat yogurt with  cup of berries.  1 apple with 2 teaspoons (11 g) of peanut butter.  Cut-up  vegetables with  cup (60 g) of hummus.  8 oz (230 mL) or 1 cup of low-fat chocolate milk.  1 stick of string cheese with 1 medium orange.  1 peanut butter and jelly sandwich that is made with one slice of whole-wheat bread and 1 tsp (5 g) of peanut butter. For 300 extra calories, you could eat two of those healthy options each day. What is a healthy amount of weight to gain? The right amount of weight gain for you is based on your BMI before you became pregnant. If your BMI:  Was less than 18 (underweight), you should gain 28-40 lb (13-18 kg).  Was 18-24.9 (normal), you should gain 25-35 lb (11-16 kg).  Was 25-29.9 (overweight), you should gain 15-25 lb (7-11 kg).  Was 30 or greater (obese), you should gain 11-20 lb (5-9 kg). What if I am having twins or multiples? Generally, if you are carrying twins or multiples:  You may need to eat 300-600 extra calories a day.  The recommended range for total weight gain is 25-54 lb (11-25 kg), depending on your BMI before pregnancy.  Talk with your health care provider to find out about nutritional needs, weight gain, and exercise that is right for you. What foods can I eat?  Fruits All fruits. Eat a variety of colors and types of fruit. Remember to wash your fruits well before peeling or eating. Vegetables All vegetables. Eat a variety of colors and types of vegetables. Remember to wash your vegetables well before peeling or eating. Grains All grains. Choose whole grains, such  as whole-wheat bread, oatmeal, or brown rice. Meats and other protein foods Lean meats, including chicken, [place], fish, and lean cuts of beef, veal, or pork. If you eat fish or seafood, choose options that are higher in omega-3 fatty acids and lower in mercury, such as salmon, herring, mussels, trout, sardines, pollock, shrimp, crab, and lobster. Tofu. Tempeh. Beans. Eggs. Peanut butter and other nut butters. Make sure that all meats, poultry, and eggs are cooked to  food-safe temperatures or "well-done." Two or more servings of fish are recommended each week in order to get the most benefits from omega-3 fatty acids that are found in seafood. Choose fish that are lower in mercury. You can find more information online:  [employer]Gaming[employer]ch Dairy Pasteurized milk and milk alternatives (such as almond milk). Pasteurized yogurt and pasteurized cheese. Cottage cheese. Sour cream. Beverages Water. Juices that contain 100% fruit juice or vegetable juice. Caffeine-free teas and decaffeinated coffee. Drinks that contain caffeine are okay to drink, but it is better to avoid caffeine. Keep your total caffeine intake to less than 200 mg each day (which is 12 oz or 355 mL of coffee, tea, or soda) or the limit as told by your health care provider. Fats and oils Fats and oils are okay to include in moderation. Sweets and desserts Sweets and desserts are okay to include in moderation. Seasoning and other foods All pasteurized condiments. The items listed above may not be a complete list of foods and beverages you can eat. Contact a dietitian for more information. What foods are not recommended? Fruits Unpasteurized fruit juices. Vegetables Raw (unpasteurized) vegetable juices. Meats and other protein foods Lunch meats, bologna, hot dogs, or other deli meats. (If you must eat those meats, reheat them until they are steaming hot.) Refrigerated pat, meat spreads from a meat counter, smoked seafood that is found in the refrigerated section of a store. Raw or undercooked meats, poultry, and eggs. Raw fish, such as sushi or sashimi. Fish that have high mercury content, such as tilefish, shark, swordfish, and king mackerel. To learn more about mercury in fish, talk with your health care provider or look for online resources, such as:  [place]aming[employer]ch Dairy Raw (unpasteurized) milk and any foods that have raw milk in them. Soft cheeses, such as feta, queso blanco, queso fresco, Brie,  Camembert cheeses, blue-veined cheeses, and Panela cheese (unless it is made with pasteurized milk, which must be stated on the label). Beverages Alcohol. Sugar-sweetened beverages, such as sodas, teas, or energy drinks. Seasoning and other foods Homemade fermented foods and drinks, such as pickles, sauerkraut, or kombucha drinks. (Store-bought pasteurized versions of these are okay.) Salads that are made in a store or deli, such as ham salad, chicken salad, egg salad, tuna salad, and seafood salad. The items listed above may not be a complete list of foods and beverages you should avoid. Contact a dietitian for more information. Where to find more information To calculate the number of calories you need based on your height, weight, and activity level, you can use an online calculator such as:  MobileTransition.ch To calculate how much weight you should gain during pregnancy, you can use an online pregnancy weight gain calculator such as:  StreamingFood.com.cy Summary  While you are pregnant, your body requires additional nutrition to help support your growing baby.  Eat a variety of foods, especially fruits and vegetables to get a full range of vitamins and minerals.  Practice good food safety and cleanliness. Wash your hands before you eat  and after you prepare raw meat. Wash all fruits and vegetables well before peeling or eating. Taking these actions can help to prevent food-borne illnesses, such as listeriosis, that can be very dangerous to your baby.  Do not eat raw meat or fish. Do not eat fish that have high mercury content, such as tilefish, shark, swordfish, and king mackerel. Do not eat unpasteurized (raw) dairy.  Take a prenatal vitamin to help meet your additional vitamin and mineral needs during pregnancy, specifically for folic acid, iron, calcium, and vitamin D. This information is not intended to replace advice given to you by  your health care provider. Make sure you discuss any questions you have with your health care provider. Document Revised: 03/17/2019 Document Reviewed: 07/24/2017 Elsevier Patient Education  [place] Weight Gain During Pregnancy, Adult A certain amount of weight gain during pregnancy is normal and healthy. How much weight you should gain depends on your overall health and a measurement called BMI (body mass index). BMI is an estimate of your body fat based on your height and weight. You can use an Freight forwarder to figure out your BMI, or you can ask your health care provider to calculate it for you at your next visit. Your recommended pregnancy weight gain is based on your pre-pregnancy BMI. General guidelines for a healthy total weight gain during pregnancy are listed below. If your BMI at or before the start of your pregnancy is:  Less than 18.5 (underweight), you should gain 28-40 lb (13-18 kg).  18.5-24.9 (normal weight), you should gain 25-35 lb (11-16 kg).  25-29.9 (overweight), you should gain 15-25 lb (7-11 kg).  30 or higher (obese), you should gain 11-20 lb (5-9 kg). These ranges vary depending on your individual health. If you are carrying more than one baby (multiples), it may be safe to gain more weight than these recommendations. If you gain less weight than recommended, that may be safe as long as your baby is growing and developing normally. How can unhealthy weight gain affect me and my baby? Gaining too much weight during pregnancy can lead to pregnancy complications, such as:  A temporary form of diabetes that develops during pregnancy (gestational diabetes).  High blood pressure during pregnancy and protein in your urine (preeclampsia).  High blood pressure during pregnancy without protein in your urine (gestational hypertension).  Your baby having a high weight at birth, which may: ? Raise your risk of having a more difficult delivery or a  surgical delivery (cesarean delivery, or C-section). ? Raise your child's risk of developing obesity during childhood. Not gaining enough weight can be life-threatening for your baby, and it may raise your baby's chances of:  Being born early (preterm).  Growing more slowly than normal during pregnancy (growth restriction).  Having a low weight at birth. What actions can I take to gain a healthy amount of weight during pregnancy? General instructions  Keep track of your weight gain during pregnancy.  Take over-the-counter and prescription medicines only as told by your health care provider. Take all prenatal supplements as directed.  Keep all health care visits during pregnancy (prenatal visits). These visits are a good time to discuss your weight gain. Your health care provider will weigh you at each visit to make sure you are gaining a healthy amount of weight. Nutrition   Eat a balanced, nutrient-rich diet. Eat plenty of: ? Fruits and vegetables, such as berries and broccoli. ? Whole grains, such as millet, barley,  whole-wheat breads and cereals, and oatmeal. ? Low-fat dairy products or non-dairy products such as almond milk or rice milk. ? Protein foods, such as lean meat, chicken, eggs, and legumes (such as peas, beans, soybeans, and lentils).  Avoid foods that are fried or have a lot of fat, salt (sodium), or sugar.  Drink enough fluid to keep your urine pale yellow.  Choose healthy snack and drink options when you are at work or on the go: ? Drink water. Avoid soda, sports drinks, and juices that have added sugar. ? Avoid drinks with caffeine, such as coffee and energy drinks. ? Eat snacks that are high in protein, such as nuts, protein bars, and low-fat yogurt. ? Carry convenient snacks in your purse that do not need refrigeration, such as a pack of trail mix, an apple, or a granola bar.  If you need help improving your diet, work with a health care provider or a diet and  nutrition specialist (dietitian). Activity   Exercise regularly, as told by your health care provider. ? If you were active before becoming pregnant, you may be able to continue your regular fitness activities. ? If you were not active before pregnancy, you may gradually build up to exercising for 30 or more minutes on most days of the week. This may include walking, swimming, or yoga.  Ask your health care provider what activities are safe for you. Talk with your health care provider about whether you may need to be excused from certain school or work activities. Where to find more information Learn more about managing your weight gain during pregnancy from:  American Pregnancy Association: www.americanpregnancy.org  [place] Department of Agriculture pregnancy weight gain calculator: FormerBoss.no Summary  Too much weight gain during pregnancy can lead to complications for you and your baby.  Find out your pre-pregnancy BMI to determine how much weight gain is healthy for you.  Eat nutritious foods and stay active.  Keep all of your prenatal visits as told by your health care provider. This information is not intended to replace advice given to you by your health care provider. Make sure you discuss any questions you have with your health care provider. Document Revised: 07/20/2019 Document Reviewed: 07/17/2017 Elsevier Patient Education  [place].   Exercise During Pregnancy Exercise is an important part of being healthy for people of all ages. Exercise improves the function of your heart and lungs and helps you maintain strength, flexibility, and a healthy body weight. Exercise also boosts energy levels and elevates mood. Most women should exercise regularly during pregnancy. In rare cases, women with certain medical conditions or complications may be asked to limit or avoid exercise during pregnancy. How does this affect me? Along with maintaining general strength  and flexibility, exercising during pregnancy can help:  Keep strength in muscles that are used during labor and childbirth.  Decrease low back pain.  Reduce symptoms of depression.  Control weight gain during pregnancy.  Reduce the risk of needing insulin if you develop diabetes during pregnancy.  Decrease the risk of cesarean delivery.  Speed up your recovery after giving birth. How does this affect my baby? Exercise can help you have a healthy pregnancy. Exercise does not cause premature birth. It will not cause your baby to weigh less at birth. What exercises can I do? Many exercises are safe for you to do during pregnancy. Do a variety of exercises that safely increase your heart and breathing rates and help you build and maintain  muscle strength. Do exercises exactly as told by your health care provider. You may do these exercises:  Walking or hiking.  Swimming.  Water aerobics.  Riding a stationary bike.  Strength training.  Modified yoga or Pilates. Tell your instructor that you are pregnant. Avoid overstretching, and avoid lying on your back for long periods of time.  Running or jogging. Only choose this type of exercise if you: ? Ran or jogged regularly before your pregnancy. ? Can run or jog and still talk in complete sentences. What exercises should I avoid? Depending on your level of fitness and whether you exercised regularly before your pregnancy, you may be told to limit high-intensity exercise. You can tell that you are exercising at a high intensity if you are breathing much harder and faster and cannot hold a conversation while exercising. You must avoid:  Contact sports.  Activities that put you at risk for falling on or being hit in the belly, such as downhill skiing, water skiing, surfing, rock climbing, cycling, gymnastics, and horseback riding.  Scuba diving.  Skydiving.  Yoga or Pilates in a room that is heated to high temperatures.  Jogging or  running, unless you ran or jogged regularly before your pregnancy. While jogging or running, you should always be able to talk in full sentences. Do not run or jog so fast that you are unable to have a conversation.  Do not exercise at more than 6,000 feet above sea level (high elevation) if you are not used to exercising at high elevation. How do I exercise in a safe way?   Avoid overheating. Do not exercise in very high temperatures.  Wear loose-fitting, breathable clothes.  Avoid dehydration. Drink enough water before, during, and after exercise to keep your urine pale yellow.  Avoid overstretching. Because of hormone changes during pregnancy, it is easy to overstretch muscles, tendons, and ligaments during pregnancy.  Start slowly and ask your health care provider to recommend the types of exercise that are safe for you.  Do not exercise to lose weight. Follow these instructions at home:  Exercise on most days or all days of the week. Try to exercise for 30 minutes a day, 5 days a week, unless your health care provider tells you not to.  If you actively exercised before your pregnancy and you are healthy, your health care provider may tell you to continue to do moderate to high-intensity exercise.  If you are just starting to exercise or did not exercise much before your pregnancy, your health care provider may tell you to do low to moderate-intensity exercise. Questions to ask your health care provider  Is exercise safe for me?  What are signs that I should stop exercising?  Does my health condition mean that I should not exercise during pregnancy?  When should I avoid exercising during pregnancy? Stop exercising and contact a health care provider if: You have any unusual symptoms, such as:  Mild contractions of the uterus or cramps in the abdomen.  Dizziness that does not go away when you rest. Stop exercising and get help right away if: You have any unusual symptoms,  such as:  Sudden, severe pain in your low back or your belly.  Mild contractions of the uterus or cramps in the abdomen that do not improve with rest and drinking fluids.  Chest pain.  Bleeding or fluid leaking from your vagina.  Shortness of breath. These symptoms may represent a serious problem that is an emergency. Do not  wait to see if the symptoms will go away. Get medical help right away. Call your local emergency services (911 in the [place] Do not drive yourself to the hospital. Summary  Most women should exercise regularly throughout pregnancy. In rare cases, women with certain medical conditions or complications may be asked to limit or avoid exercise during pregnancy.  Do not exercise to lose weight during pregnancy.  Your health care provider will tell you what level of physical activity is right for you.  Stop exercising and contact a health care provider if you have mild contractions of the uterus or cramps in the abdomen. Get help right away if these contractions or cramps do not improve with rest and drinking fluids.  Stop exercising and get help right away if you have sudden, severe pain in your low back or belly, chest pain, shortness of breath, or bleeding or leaking of fluid from your vagina. This information is not intended to replace advice given to you by your health care provider. Make sure you discuss any questions you have with your health care provider. Document Revised: 02/17/2019 Document Reviewed: 12/01/2018 Elsevier Patient Education  [place] of Pregnancy  The first trimester of pregnancy is from week 1 until the end of week 13 (months 1 through 3). During this time, your baby will begin to develop inside you. At 6-8 weeks, the eyes and face are formed, and the heartbeat can be seen on ultrasound. At the end of 12 weeks, all the baby's organs are formed. Prenatal care is all the medical care you receive before the birth of your  baby. Make sure you get good prenatal care and follow all of your doctor's instructions. Follow these instructions at home: Medicines  Take over-the-counter and prescription medicines only as told by your doctor. Some medicines are safe and some medicines are not safe during pregnancy.  Take a prenatal vitamin that contains at least 600 micrograms (mcg) of folic acid.  If you have trouble pooping (constipation), take medicine that will make your stool soft (stool softener) if your doctor approves. Eating and drinking   Eat regular, healthy meals.  Your doctor will tell you the amount of weight gain that is right for you.  Avoid raw meat and uncooked cheese.  If you feel sick to your stomach (nauseous) or throw up (vomit): ? Eat 4 or 5 small meals a day instead of 3 large meals. ? Try eating a few soda crackers. ? Drink liquids between meals instead of during meals.  To prevent constipation: ? Eat foods that are high in fiber, like fresh fruits and vegetables, whole grains, and beans. ? Drink enough fluids to keep your pee (urine) clear or pale yellow. Activity  Exercise only as told by your doctor. Stop exercising if you have cramps or pain in your lower belly (abdomen) or low back.  Do not exercise if it is too hot, too humid, or if you are in a place of great height (high altitude).  Try to avoid standing for long periods of time. Move your legs often if you must stand in one place for a long time.  Avoid heavy lifting.  Wear low-heeled shoes. Sit and stand up straight.  You can have sex unless your doctor tells you not to. Relieving pain and discomfort  Wear a good support bra if your breasts are sore.  Take warm water baths (sitz baths) to soothe pain or discomfort caused by hemorrhoids. Use  hemorrhoid cream if your doctor says it is okay.  Rest with your legs raised if you have leg cramps or low back pain.  If you have puffy, bulging veins (varicose veins) in your  legs: ? Wear support hose or compression stockings as told by your doctor. ? Raise (elevate) your feet for 15 minutes, 3-4 times a day. ? Limit salt in your food. Prenatal care  Schedule your prenatal visits by the twelfth week of pregnancy.  Write down your questions. Take them to your prenatal visits.  Keep all your prenatal visits as told by your doctor. This is important. Safety  Wear your seat belt at all times when driving.  Make a list of emergency phone numbers. The list should include numbers for family, friends, the hospital, and police and fire departments. General instructions  Ask your doctor for a referral to a local prenatal class. Begin classes no later than at the start of month 6 of your pregnancy.  Ask for help if you need counseling or if you need help with nutrition. Your doctor can give you advice or tell you where to go for help.  Do not use hot tubs, steam rooms, or saunas.  Do not douche or use tampons or scented sanitary pads.  Do not cross your legs for long periods of time.  Avoid all herbs and alcohol. Avoid drugs that are not approved by your doctor.  Do not use any tobacco products, including cigarettes, chewing tobacco, and electronic cigarettes. If you need help quitting, ask your doctor. You may get counseling or other support to help you quit.  Avoid cat litter boxes and soil used by cats. These carry germs that can cause birth defects in the baby and can cause a loss of your baby (miscarriage) or stillbirth.  Visit your dentist. At home, brush your teeth with a soft toothbrush. Be gentle when you floss. Contact a doctor if:  You are dizzy.  You have mild cramps or pressure in your lower belly.  You have a nagging pain in your belly area.  You continue to feel sick to your stomach, you throw up, or you have watery poop (diarrhea).  You have a bad smelling fluid coming from your vagina.  You have pain when you pee (urinate).  You have  increased puffiness (swelling) in your face, hands, legs, or ankles. Get help right away if:  You have a fever.  You are leaking fluid from your vagina.  You have spotting or bleeding from your vagina.  You have very bad belly cramping or pain.  You gain or lose weight rapidly.  You throw up blood. It may look like coffee grounds.  You are around people who have [place] measles, fifth disease, or chickenpox.  You have a very bad headache.  You have shortness of breath.  You have any kind of trauma, such as from a fall or a car accident. Summary  The first trimester of pregnancy is from week 1 until the end of week 13 (months 1 through 3).  To take care of yourself and your unborn baby, you will need to eat healthy meals, take medicines only if your doctor tells you to do so, and do activities that are safe for you and your baby.  Keep all follow-up visits as told by your doctor. This is important as your doctor will have to ensure that your baby is healthy and growing well. This information is not intended to replace advice given to  you by your health care provider. Make sure you discuss any questions you have with your health care provider. Document Revised: 02/17/2019 Document Reviewed: 11/04/2016 Elsevier Patient Education  [place].   Heartburn During Pregnancy  Heartburn is pain or discomfort in the throat or chest. It may cause a burning feeling. It happens when stomach acid moves up into the tube that carries food from your mouth to your stomach (esophagus). Heartburn is common during pregnancy. It usually goes away or gets better after giving birth. Follow these instructions at home: Eating and drinking  Do not drink alcohol while you are pregnant.  Figure out which foods and beverages make you feel worse, and avoid them.  Beverages that you may want to avoid include: ? Coffee and tea (with or without caffeine). ? Energy drinks and sports  drinks. ? Bubbly (carbonated) drinks or sodas. ? Citrus fruit juices.  Foods that you may want to avoid include: ? Chocolate and cocoa. ? Peppermint and mint flavorings. ? Garlic, onions, and horseradish. ? Spicy and acidic foods. These include peppers, chili powder, curry powder, vinegar, hot sauces, and barbecue sauce. ? Citrus fruits, such as oranges, lemons, and limes. ? Tomato-based foods, such as red sauce, chili, and salsa. ? Fried and fatty foods, such as donuts, french fries, potato chips, and high-fat dressings. ? High-fat meats, such as hot dogs, cold cuts, sausage, ham, and bacon. ? High-fat dairy items, such as whole milk, butter, and cheese.  Eat small meals often, instead of large meals.  Avoid drinking a lot of liquid with your meals.  Avoid eating meals during the 2-3 hours before you go to bed.  Avoid lying down right after you eat.  Do not exercise right after you eat. Medicines  Take over-the-counter and prescription medicines only as told by your doctor.  Do not take aspirin, ibuprofen, or other NSAIDs unless your doctor tells you to do that.  Your doctor may tell you to avoid medicines that have sodium bicarbonate in them. General instructions   If told, raise the head of your bed about 6 inches (15 cm). You can do this by putting blocks under the legs. Sleeping with more pillows does not help with heartburn.  Do not use any products that contain nicotine or tobacco, such as cigarettes and e-cigarettes. If you need help quitting, ask your doctor.  Wear loose-fitting clothing.  Try to lower your stress, such as with yoga or meditation. If you need help, ask your doctor.  Stay at a healthy weight. If you are overweight, work with your doctor to safely lose weight.  Keep all follow-up visits as told by your doctor. This is important. Contact a doctor if:  You get new symptoms.  Your symptoms do not get better with treatment.  You have weight loss  and you do not know why.  You have trouble swallowing.  You make loud sounds when you breathe (wheeze).  You have a cough that does not go away.  You have heartburn often for more than 2 weeks.  You feel sick to your stomach (nauseous), and this does not get better with treatment.  You are throwing up (vomiting), and this does not get better with treatment.  You have pain in your belly (abdomen). Get help right away if:  You have very bad chest pain that spreads to your arm, neck, or jaw.  You feel sweaty, dizzy, or light-headed.  You have trouble breathing.  You have pain when swallowing.  You throw up and your throw-up looks like blood or coffee grounds.  Your poop (stool) is bloody or black. This information is not intended to replace advice given to you by your health care provider. Make sure you discuss any questions you have with your health care provider. Document Revised: 02/17/2019 Document Reviewed: 07/14/2016 Elsevier Patient Education  [place]. Morning Sickness  Morning sickness is when you feel sick to your stomach (nauseous) during pregnancy. You may feel sick to your stomach and throw up (vomit). You may feel sick in the morning, but you can feel this way at any time of day. Some women feel very sick to their stomach and cannot stop throwing up (hyperemesis gravidarum). Follow these instructions at home: Medicines  Take over-the-counter and prescription medicines only as told by your doctor. Do not take any medicines until you talk with your doctor about them first.  Taking multivitamins before getting pregnant can stop or lessen the harshness of morning sickness. Eating and drinking  Eat dry toast or crackers before getting out of bed.  Eat 5 or 6 small meals a day.  Eat dry and bland foods like rice and baked potatoes.  Do not eat greasy, fatty, or spicy foods.  Have someone cook for you if the smell of food causes you to feel sick or  throw up.  If you feel sick to your stomach after taking prenatal vitamins, take them at night or with a snack.  Eat protein when you need a snack. Nuts, yogurt, and cheese are good choices.  Drink fluids throughout the day.  Try ginger ale made with real ginger, ginger tea made from fresh grated ginger, or ginger candies. General instructions  Do not use any products that have nicotine or tobacco in them, such as cigarettes and e-cigarettes. If you need help quitting, ask your doctor.  Use an air purifier to keep the air in your house free of smells.  Get lots of fresh air.  Try to avoid smells that make you feel sick.  Try: ? Wearing a bracelet that is used for seasickness (acupressure wristband). ? Going to a doctor who puts thin needles into certain body points (acupuncture) to improve how you feel. Contact a doctor if:  You need medicine to feel better.  You feel dizzy or light-headed.  You are losing weight. Get help right away if:  You feel very sick to your stomach and cannot stop throwing up.  You pass out (faint).  You have very bad pain in your belly. Summary  Morning sickness is when you feel sick to your stomach (nauseous) during pregnancy.  You may feel sick in the morning, but you can feel this way at any time of day.  Making some changes to what you eat may help your symptoms go away. This information is not intended to replace advice given to you by your health care provider. Make sure you discuss any questions you have with your health care provider. Document Revised: 10/09/2017 Document Reviewed: 11/27/2016 Elsevier Patient Education  [place]. Common Medications Safe in Pregnancy  Acne:      Constipation:  Benzoyl Peroxide     Colace  Clindamycin      Dulcolax Suppository  Topica Erythromycin     Fibercon  Salicylic Acid      Metamucil         Miralax AVOID:        Senakot   Accutane    Cough:  Retin-A  Cough  Drops  Tetracycline      Phenergan w/ Codeine if Rx  Minocycline      Robitussin (Plain & DM)  Antibiotics:     Crabs/Lice:  Ceclor       RID  Cephalosporins    AVOID:  E-Mycins      Kwell  Keflex  Macrobid/Macrodantin   Diarrhea:  Penicillin      Kao-Pectate  Zithromax      Imodium AD         PUSH FLUIDS AVOID:       Cipro     Fever:  Tetracycline      Tylenol (Regular or Extra  Minocycline       Strength)  Levaquin      Extra Strength-Do not          Exceed 8 tabs/24 hrs Caffeine:        <226m/day (equiv. To 1 cup of coffee or  approx. 3 12 oz sodas)         Gas: Cold/Hayfever:       Gas-X  Benadryl      Mylicon  Claritin       Phazyme  **Claritin-D        Chlor-Trimeton    Headaches:  Dimetapp      ASA-Free Excedrin  Drixoral-Non-Drowsy     Cold Compress  Mucinex (Guaifenasin)     Tylenol (Regular or Extra  Sudafed/Sudafed-12 Hour     Strength)  **Sudafed PE Pseudoephedrine   Tylenol Cold & Sinus     Vicks Vapor Rub  Zyrtec  **AVOID if Problems With Blood Pressure         Heartburn: Avoid lying down for at least 1 hour after meals  Aciphex      Maalox     Rash:  Milk of Magnesia     Benadryl    Mylanta       1% Hydrocortisone Cream  Pepcid  Pepcid Complete   Sleep Aids:  Prevacid      Ambien   Prilosec       Benadryl  Rolaids       Chamomile Tea  Tums (Limit 4/day)     Unisom         Tylenol PM         Warm milk-add vanilla or  Hemorrhoids:       Sugar for taste  Anusol/Anusol H.C.  (RX: Analapram 2.5%)  Sugar Substitutes:  Hydrocortisone OTC     Ok in moderation  Preparation H      Tucks        Vaseline lotion applied to tissue with wiping    Herpes:     Throat:  Acyclovir      Oragel  Famvir  Valtrex     Vaccines:         Flu Shot Leg Cramps:       *Gardasil  Benadryl      Hepatitis A         Hepatitis B Nasal Spray:       Pneumovax  Saline Nasal Spray     Polio Booster         Tetanus Nausea:       Tuberculosis test or PPD  Vitamin  B6 25 mg TID   AVOID:    Dramamine      *Gardasil  Emetrol       Live Poliovirus  Ginger Root 250 mg QID    MMR (measles, mumps &  High Complex Carbs @ Bedtime    rebella)  Sea Bands-Accupressure    Varicella (Chickenpox)  Unisom 1/2 tab TID     *No known complications           If received before Pain:         Known pregnancy;   Darvocet       Resume series after  Lortab        Delivery  Percocet    Yeast:   Tramadol      Femstat  Tylenol 3      Gyne-lotrimin  Ultram       Monistat  Vicodin           MISC:         All Sunscreens           Hair Coloring/highlights          Insect Repellant's          (Including DEET)         Mystic Tans   Vaginal Birth After Cesarean Delivery  Vaginal birth after cesarean delivery (VBAC) is giving birth vaginally after previously delivering a baby through a cesarean section (C-section). A VBAC may be a safe option for you, depending on your health and other factors. It is important to discuss VBAC with your health care provider early in your pregnancy so you can understand the risks, benefits, and options. Having these discussions early will give you time to make your birth plan. Who are the best candidates for VBAC? The best candidates for VBAC are women who:  Have had one or two prior cesarean deliveries, and the incision made during the delivery was horizontal (low transverse).  Do not have a vertical (classical) scar on their uterus.  Have not had a tear in the wall of their uterus (uterine rupture).  Plan to have more pregnancies. A VBAC is also more likely to be successful:  In women who have previously given birth vaginally.  When labor starts by itself (spontaneously) before the due date. What are the benefits of VBAC? The benefits of delivering your baby vaginally instead of by a cesarean delivery include:  A shorter hospital stay.  A faster recovery time.  Less pain.  Avoiding risks associated with major surgery, such as  infection and blood clots.  Less blood loss and less need for donated blood (transfusions). What are the risks of VBAC? The main risk of attempting a VBAC is that it may fail, forcing your health care provider to deliver your baby by a C-section. Other risks are rare and include:  Tearing (rupture) of the scar from a past cesarean delivery.  Other risks associated with vaginal deliveries. If a repeat cesarean delivery is needed, the risks include:  Blood loss.  Infection.  Blood clot.  Damage to surrounding organs.  Removal of the uterus (hysterectomy), if it is damaged.  Placenta problems in future pregnancies. What else should I know about my options? Delivering a baby through a VBAC is similar to having a normal spontaneous vaginal delivery. Therefore, it is safe:  To try with twins.  For your health care provider to try to turn the baby from a breech position (external cephalic version) during labor.  With epidural analgesia for pain relief. Consider where you would like to deliver your baby. VBAC should be attempted in facilities where an emergency cesarean delivery can be performed. VBAC is not recommended for home births. Any changes in your health or your baby's  health during your pregnancy may make it necessary to change your initial decision about VBAC. Your health care provider may recommend that you do not attempt a VBAC if:  Your baby's suspected weight is 8.8 lb (4 kg) or more.  You have preeclampsia. This is a condition that causes high blood pressure along with other symptoms, such as swelling and headaches.  You will have VBAC less than 19 months after your cesarean delivery.  You are past your due date.  You need to have labor started (induced) because your cervix is not ready for labor (unfavorable). Where to find more information  American Pregnancy Association: americanpregnancy.org  [employer] of Obstetricians and Gynecologists:  acog.org Summary  Vaginal birth after cesarean delivery (VBAC) is giving birth vaginally after previously delivering a baby through a cesarean section (C-section). A VBAC may be a safe option for you, depending on your health and other factors.  Discuss VBAC with your health care provider early in your pregnancy so you can understand the risks, benefits, options, and have plenty of time to make your birth plan.  The main risk of attempting a VBAC is that it may fail, forcing your health care provider to deliver your baby by a C-section. Other risks are rare. This information is not intended to replace advice given to you by your health care provider. Make sure you discuss any questions you have with your health care provider. Document Revised: 02/22/2019 Document Reviewed: 02/03/2017 Elsevier Patient Education  [place].   Preparing for Vaginal Birth After Cesarean Delivery Vaginal birth after cesarean delivery (VBAC) is giving birth vaginally after previously delivering a baby through a cesarean section (C-section). You and your health are provider will discuss your options and whether you may be a good candidate for VBAC. What are my options? After a cesarean delivery, your options for future deliveries may include:  Scheduled repeat cesarean delivery. This is done in a hospital with an operating room.  Trial of labor after cesarean (TOLAC). A successful TOLAC results in a vaginal delivery. If it is not successful, you will need to have a cesarean delivery. TOLAC should be attempted in facilities where an emergency cesarean delivery can be performed. It should not be done as a home birth. Talk with your health care provider about the risks and benefits of each option early in your pregnancy. The best option for you will depend on your preferences and your overall health as well as your baby's. What should I know about my past cesarean delivery? It is important to know what type of  incision was made in your uterus in a past cesarean delivery. The type of incision can affect the success of your TOLAC. Types of incisions include:  Low transverse. This is a side-to-side cut low on your uterus. The scar on your skin looks like a horizontal line just above your pubic area. This type of cut is the most common and makes you a good candidate for TOLAC.  Low vertical. This is an up-and-down cut low on your uterus. The scar on your skin looks like a vertical line between your pubic area and belly button. This type of cut puts you at higher risk for problems during TOLAC.  High vertical or classical. This is an up-and-down cut high on your uterus. The scar on your skin looks like a vertical line that runs over the top of your belly button. This type of cut has the highest risk for problems and usually means  that TOLAC is not an option. When is VBAC not an option? As you progress through your pregnancy, circumstances may change and you may need to reconsider your options. Your situation may also change even as you begin TOLAC. Your health care provider may not want you to attempt a VBAC if you:  Need to have labor started (induced) because your cervix is not ready for labor.  Have never had a vaginal delivery.  Have had more than two cesarean deliveries.  Are overdue.  Are pregnant with a very large baby.  Have a condition that causes high blood pressure (preeclampsia). Questions to ask your health care provider  Am I a good candidate for TOLAC?  What are my chances of a successful vaginal delivery?  Is my preferred birth location equipped for a TOLAC?  What are my pain management options during a TOLAC? Where to find more information  American Congress of Obstetricians and Gynecologists: www.acog.[place]: www.midwife.org Summary  Vaginal birth after cesarean delivery (VBAC) is giving birth vaginally after previously delivering a baby  through a cesarean section (C-section).  VBAC may be a safe and appropriate option for you depending on your medical history and other risk factors. Talk with your health care provider about the options available to you, and the risks and benefits of each early in your pregnancy.  TOLAC should be attempted in facilities where emergency cesarean section procedures can be performed. This information is not intended to replace advice given to you by your health care provider. Make sure you discuss any questions you have with your health care provider. Document Revised: 02/22/2019 Document Reviewed: 02/05/2017 Elsevier Patient Education  [place].

## 2020-05-21 NOTE — Progress Notes (Signed)
PT is present today for confirmation of pregnancy. Pt LMP 04/09/2020. UPT done today results were positive. Pt c/o of heartburn and having diarrhea. Safe medication list given to patient.

## 2020-05-27 NOTE — Progress Notes (Addendum)
GYN ENCOUNTER NOTE  Subjective:       Vanessa Mullins is a 24 y.o. G32P1001 female here for pregnancy confirmation of planned pregnancy.   Reports positive home pregnancy test. Endorses breast tenderness, reflux, diarrhea and intermittent abdominal cramping.   Taking prenatal vitamin with folic acid and DHA.   History significant for primary cesarean section due to fetal malpresentation-breech.   Denies difficulty breathing or respiratory distress, chest pain, abdominal pain, vaginal bleeding, dysuria, and leg pain or swelling.    Gynecologic History  Patient's last menstrual period was 04/09/2020 (within days). Irregular   Estimated date of birth: 01/14/2021  Gestational age: 87 weeks 0 days  Contraception: none  Last Pap: 03/2017. Results were: normal  Obstetric History  OB History  Gravida Para Term Preterm AB Living  1 1 1     1   SAB TAB Ectopic Multiple Live Births          1    # Outcome Date GA Lbr Len/2nd Weight Sex Delivery Anes PTL Lv  1 Term 06/05/14   6 lb 7 oz (2.92 kg) M CS-Unspec Spinal  LIV    Past Medical History:  Diagnosis Date   Asthma    Inguinal hernia     Past Surgical History:  Procedure Laterality Date   CESAREAN SECTION      No current outpatient medications on file prior to visit.   No current facility-administered medications on file prior to visit.    Allergies  Allergen Reactions   Amoxicillin Rash    Social History   Socioeconomic History   Marital status: Married    Spouse name: Not on file   Number of children: Not on file   Years of education: Not on file   Highest education level: Not on file  Occupational History   Not on file  Tobacco Use   Smoking status: Never Smoker   Smokeless tobacco: Never Used  Vaping Use   Vaping Use: Never used  Substance and Sexual Activity   Alcohol use: No   Drug use: No   Sexual activity: Yes    Birth control/protection: None  Other Topics Concern    Not on file  Social History Narrative   Not on file   Social Determinants of Health   Financial Resource Strain:    Difficulty of Paying Living Expenses:   Food Insecurity:    Worried About 06/07/14 in the Last Year:    Programme researcher, broadcasting/film/video in the Last Year:   Transportation Needs:    Barista (Medical):    Lack of Transportation (Non-Medical):   Physical Activity:    Days of Exercise per Week:    Minutes of Exercise per Session:   Stress:    Feeling of Stress :   Social Connections:    Frequency of Communication with Friends and Family:    Frequency of Social Gatherings with Friends and Family:    Attends Religious Services:    Active Member of Clubs or Organizations:    Attends Freight forwarder:    Marital Status:   Intimate Partner Violence:    Fear of Current or Ex-Partner:    Emotionally Abused:    Physically Abused:    Sexually Abused:     Family History  Problem Relation Age of Onset   Hypertension Mother    Hypertension Father     The following portions of the patient's history were reviewed and updated as appropriate:  allergies, current medications, past family history, past medical history, past social history, past surgical history and problem list.  Review of Systems  ROS negative except as noted above. Information obtained from patient.   Objective:   BP 115/80    Pulse 86    Ht 5\' 5"  (1.651 m)    Wt 227 lb 9.6 oz (103.2 kg)    LMP 04/09/2020 (Within Days)    BMI 37.87 kg/m    CONSTITUTIONAL: Well-developed, well-nourished female in no acute distress.   PHYSICAL EXAM: Not indicated.   Recent Results (from the past 2160 hour(s))  POCT urine pregnancy     Status: Abnormal   Collection Time: 05/21/20  2:07 PM  Result Value Ref Range   Preg Test, Ur Positive (A) Negative    Assessment:   1. Missed menses  - POCT urine pregnancy  2. Positive urine pregnancy test   3. Patient desires  vaginal birth after cesarean section (VBAC)   4. Abdominal cramping affecting pregnancy   Plan:   First trimester education, see AVS.   Reviewed red flag symptoms and when to call.   RTC x 2-3 weeks for dating/viability ultrasound & intake.  RTC x 7-8 weeks for NOB PE or sooner if needed.    07/22/20, CNM [facility], [facility]   A total of 20 minutes were spent face-to-face with the patient during this encounter and over half of that time dealt with counseling and coordination of care.

## 2020-05-29 ENCOUNTER — Other Ambulatory Visit: Payer: Self-pay | Admitting: Certified Nurse Midwife

## 2020-05-29 DIAGNOSIS — O210 Mild hyperemesis gravidarum: Secondary | ICD-10-CM

## 2020-05-29 MED ORDER — ONDANSETRON 4 MG PO TBDP: 4.0000 mg | ORAL_TABLET | Freq: Four times a day (QID) | ORAL | 0 refills | Status: DC | PRN

## 2020-05-29 MED ORDER — BONJESTA 20-20 MG PO TBCR: 1.0000 | EXTENDED_RELEASE_TABLET | Freq: Two times a day (BID) | ORAL | 3 refills | Status: DC

## 2020-05-29 NOTE — Progress Notes (Signed)
Rx Bonjesta and Zofran, see orders.   Serafina Royals, CNM [facility], [facility] 05/29/20 1:47 PM

## 2020-05-30 ENCOUNTER — Telehealth: Payer: Self-pay

## 2020-05-30 NOTE — Telephone Encounter (Signed)
mychart message sent to patientSamara Deist PA approved

## 2020-06-14 ENCOUNTER — Ambulatory Visit (INDEPENDENT_AMBULATORY_CARE_PROVIDER_SITE_OTHER): Payer: 59 | Admitting: Certified Nurse Midwife

## 2020-06-14 ENCOUNTER — Ambulatory Visit (INDEPENDENT_AMBULATORY_CARE_PROVIDER_SITE_OTHER): Payer: 59

## 2020-06-14 ENCOUNTER — Encounter: Payer: Self-pay | Admitting: Certified Nurse Midwife

## 2020-06-14 VITALS — BP 114/72 | HR 73 | Ht 65.0 in | Wt 226.7 lb

## 2020-06-14 DIAGNOSIS — Z3491 Encounter for supervision of normal pregnancy, unspecified, first trimester: Secondary | ICD-10-CM | POA: Diagnosis not present

## 2020-06-14 DIAGNOSIS — Z3201 Encounter for pregnancy test, result positive: Secondary | ICD-10-CM | POA: Diagnosis not present

## 2020-06-14 DIAGNOSIS — O34219 Maternal care for unspecified type scar from previous cesarean delivery: Secondary | ICD-10-CM

## 2020-06-14 DIAGNOSIS — Z3A09 9 weeks gestation of pregnancy: Secondary | ICD-10-CM | POA: Diagnosis not present

## 2020-06-14 DIAGNOSIS — N926 Irregular menstruation, unspecified: Secondary | ICD-10-CM | POA: Diagnosis not present

## 2020-06-14 DIAGNOSIS — R109 Unspecified abdominal pain: Secondary | ICD-10-CM

## 2020-06-14 DIAGNOSIS — O26899 Other specified pregnancy related conditions, unspecified trimester: Secondary | ICD-10-CM

## 2020-06-14 NOTE — Patient Instructions (Signed)
First Trimester of Pregnancy  The first trimester of pregnancy is from week 1 until the end of week 13 (months 1 through 3). During this time, your baby will begin to develop inside you. At 6-8 weeks, the eyes and face are formed, and the heartbeat can be seen on ultrasound. At the end of 12 weeks, all the baby's organs are formed. Prenatal care is all the medical care you receive before the birth of your baby. Make sure you get good prenatal care and follow all of your doctor's instructions. Follow these instructions at home: Medicines  Take over-the-counter and prescription medicines only as told by your doctor. Some medicines are safe and some medicines are not safe during pregnancy.  Take a prenatal vitamin that contains at least 600 micrograms (mcg) of folic acid.  If you have trouble pooping (constipation), take medicine that will make your stool soft (stool softener) if your doctor approves. Eating and drinking   Eat regular, healthy meals.  Your doctor will tell you the amount of weight gain that is right for you.  Avoid raw meat and uncooked cheese.  If you feel sick to your stomach (nauseous) or throw up (vomit): ? Eat 4 or 5 small meals a day instead of 3 large meals. ? Try eating a few soda crackers. ? Drink liquids between meals instead of during meals.  To prevent constipation: ? Eat foods that are high in fiber, like fresh fruits and vegetables, whole grains, and beans. ? Drink enough fluids to keep your pee (urine) clear or pale yellow. Activity  Exercise only as told by your doctor. Stop exercising if you have cramps or pain in your lower belly (abdomen) or low back.  Do not exercise if it is too hot, too humid, or if you are in a place of great height (high altitude).  Try to avoid standing for long periods of time. Move your legs often if you must stand in one place for a long time.  Avoid heavy lifting.  Wear low-heeled shoes. Sit and stand up  straight.  You can have sex unless your doctor tells you not to. Relieving pain and discomfort  Wear a good support bra if your breasts are sore.  Take warm water baths (sitz baths) to soothe pain or discomfort caused by hemorrhoids. Use hemorrhoid cream if your doctor says it is okay.  Rest with your legs raised if you have leg cramps or low back pain.  If you have puffy, bulging veins (varicose veins) in your legs: ? Wear support hose or compression stockings as told by your doctor. ? Raise (elevate) your feet for 15 minutes, 3-4 times a day. ? Limit salt in your food. Prenatal care  Schedule your prenatal visits by the twelfth week of pregnancy.  Write down your questions. Take them to your prenatal visits.  Keep all your prenatal visits as told by your doctor. This is important. Safety  Wear your seat belt at all times when driving.  Make a list of emergency phone numbers. The list should include numbers for family, friends, the hospital, and police and fire departments. General instructions  Ask your doctor for a referral to a local prenatal class. Begin classes no later than at the start of month 6 of your pregnancy.  Ask for help if you need counseling or if you need help with nutrition. Your doctor can give you advice or tell you where to go for help.  Do not use hot tubs, steam   rooms, or saunas.  Do not douche or use tampons or scented sanitary pads.  Do not cross your legs for long periods of time.  Avoid all herbs and alcohol. Avoid drugs that are not approved by your doctor.  Do not use any tobacco products, including cigarettes, chewing tobacco, and electronic cigarettes. If you need help quitting, ask your doctor. You may get counseling or other support to help you quit.  Avoid cat litter boxes and soil used by cats. These carry germs that can cause birth defects in the baby and can cause a loss of your baby (miscarriage) or stillbirth.  Visit your dentist.  At home, brush your teeth with a soft toothbrush. Be gentle when you floss. Contact a doctor if:  You are dizzy.  You have mild cramps or pressure in your lower belly.  You have a nagging pain in your belly area.  You continue to feel sick to your stomach, you throw up, or you have watery poop (diarrhea).  You have a bad smelling fluid coming from your vagina.  You have pain when you pee (urinate).  You have increased puffiness (swelling) in your face, hands, legs, or ankles. Get help right away if:  You have a fever.  You are leaking fluid from your vagina.  You have spotting or bleeding from your vagina.  You have very bad belly cramping or pain.  You gain or lose weight rapidly.  You throw up blood. It may look like coffee grounds.  You are around people who have German measles, fifth disease, or chickenpox.  You have a very bad headache.  You have shortness of breath.  You have any kind of trauma, such as from a fall or a car accident. Summary  The first trimester of pregnancy is from week 1 until the end of week 13 (months 1 through 3).  To take care of yourself and your unborn baby, you will need to eat healthy meals, take medicines only if your doctor tells you to do so, and do activities that are safe for you and your baby.  Keep all follow-up visits as told by your doctor. This is important as your doctor will have to ensure that your baby is healthy and growing well. This information is not intended to replace advice given to you by your health care provider. Make sure you discuss any questions you have with your health care provider. Document Revised: 02/17/2019 Document Reviewed: 11/04/2016 [employer] Patient Education  2020 [employer]   Common Medications Safe in Pregnancy  Acne:      Constipation:  Benzoyl Peroxide     Colace  Clindamycin      Dulcolax Suppository  Topica Erythromycin     Fibercon  Salicylic  Acid      Metamucil         Miralax AVOID:        Senakot   Accutane    Cough:  Retin-A       Cough Drops  Tetracycline      Phenergan w/ Codeine if Rx  Minocycline      Robitussin (Plain & DM)  Antibiotics:     Crabs/Lice:  Ceclor       RID  Cephalosporins    AVOID:  E-Mycins      Kwell  Keflex  Macrobid/Macrodantin   Diarrhea:  Penicillin      Kao-Pectate  Zithromax      Imodium AD         PUSH   FLUIDS AVOID:       Cipro     Fever:  Tetracycline      Tylenol (Regular or Extra  Minocycline       Strength)  Levaquin      Extra Strength-Do not          Exceed 8 tabs/24 hrs Caffeine:        <200mg/day (equiv. To 1 cup of coffee or  approx. 3 12 oz sodas)         Gas: Cold/Hayfever:       Gas-X  Benadryl      Mylicon  Claritin       Phazyme  **Claritin-D        Chlor-Trimeton    Headaches:  Dimetapp      ASA-Free Excedrin  Drixoral-Non-Drowsy     Cold Compress  Mucinex (Guaifenasin)     Tylenol (Regular or Extra  Sudafed/Sudafed-12 Hour     Strength)  **Sudafed PE Pseudoephedrine   Tylenol Cold & Sinus     Vicks Vapor Rub  Zyrtec  **AVOID if Problems With Blood Pressure         Heartburn: Avoid lying down for at least 1 hour after meals  Aciphex      Maalox     Rash:  Milk of Magnesia     Benadryl    Mylanta       1% Hydrocortisone Cream  Pepcid  Pepcid Complete   Sleep Aids:  Prevacid      Ambien   Prilosec       Benadryl  Rolaids       Chamomile Tea  Tums (Limit 4/day)     Unisom         Tylenol PM         Warm milk-add vanilla or  Hemorrhoids:       Sugar for taste  Anusol/Anusol H.C.  (RX: Analapram 2.5%)  Sugar Substitutes:  Hydrocortisone OTC     Ok in moderation  Preparation H      Tucks        Vaseline lotion applied to tissue with wiping    Herpes:     Throat:  Acyclovir      Oragel  Famvir  Valtrex     Vaccines:         Flu Shot Leg Cramps:       *Gardasil  Benadryl      Hepatitis A         Hepatitis B Nasal  Spray:       Pneumovax  Saline Nasal Spray     Polio Booster         Tetanus Nausea:       Tuberculosis test or PPD  Vitamin B6 25 mg TID   AVOID:    Dramamine      *Gardasil  Emetrol       Live Poliovirus  Ginger Root 250 mg QID    MMR (measles, mumps &  High Complex Carbs @ Bedtime    rebella)  Sea Bands-Accupressure    Varicella (Chickenpox)  Unisom 1/2 tab TID     *No known complications           If received before Pain:         Known pregnancy;   Darvocet       Resume series after  Lortab        Delivery  Percocet    Yeast:   Tramadol        Femstat  Tylenol 3      Gyne-lotrimin  Ultram       Monistat  Vicodin           MISC:         All Sunscreens           Hair Coloring/highlights          Insect Repellant's          (Including DEET)         Mystic Tans  

## 2020-06-14 NOTE — Progress Notes (Signed)
Vanessa Mullins presents for NOB nurse interview visit. Pregnancy confirmation done 05/21/2020. G2. P1001. Pregnancy education material explained and given. 0 cats in home. NOB labs ordered. TSH/HbgA1c ordered due to BMI 30 or greater.  HIV labs and drug screen were explained and ordered. PNV encouraged. Genetic screening options discussed. Genetic testing: Unsure. Patient may discuss with the provider. Patient to follow up with provider on 07/09/2020 weeks for NOB physical. All questions answered.

## 2020-06-15 ENCOUNTER — Encounter: Payer: Self-pay | Admitting: Certified Nurse Midwife

## 2020-06-15 DIAGNOSIS — Z2839 Other underimmunization status: Secondary | ICD-10-CM | POA: Insufficient documentation

## 2020-06-15 DIAGNOSIS — O99210 Obesity complicating pregnancy, unspecified trimester: Secondary | ICD-10-CM | POA: Insufficient documentation

## 2020-06-15 DIAGNOSIS — O09899 Supervision of other high risk pregnancies, unspecified trimester: Secondary | ICD-10-CM | POA: Insufficient documentation

## 2020-06-15 DIAGNOSIS — Z6721 Type B blood, Rh negative: Secondary | ICD-10-CM | POA: Insufficient documentation

## 2020-06-15 DIAGNOSIS — Z98891 History of uterine scar from previous surgery: Secondary | ICD-10-CM | POA: Insufficient documentation

## 2020-06-15 DIAGNOSIS — O34219 Maternal care for unspecified type scar from previous cesarean delivery: Secondary | ICD-10-CM | POA: Insufficient documentation

## 2020-06-15 LAB — URINALYSIS, ROUTINE W REFLEX MICROSCOPIC
Bilirubin, UA: NEGATIVE
Glucose, UA: NEGATIVE
Ketones, UA: NEGATIVE
Leukocytes,UA: NEGATIVE
Nitrite, UA: NEGATIVE
Protein,UA: NEGATIVE
RBC, UA: NEGATIVE
Specific Gravity, UA: 1.023 (ref 1.005–1.030)
Urobilinogen, Ur: 0.2 mg/dL (ref 0.2–1.0)
pH, UA: 6 (ref 5.0–7.5)

## 2020-06-15 LAB — VARICELLA ZOSTER ANTIBODY, IGG: Varicella zoster IgG: <135 index — ABNORMAL LOW (ref >165)

## 2020-06-15 LAB — ABO AND RH: Rh Factor: NEGATIVE

## 2020-06-15 LAB — NICOTINE SCREEN, URINE: Cotinine Ql Scrn, Ur: NEGATIVE ng/mL

## 2020-06-15 LAB — HIV ANTIBODY (ROUTINE TESTING W REFLEX): HIV Screen 4th Generation wRfx: NONREACTIVE

## 2020-06-15 LAB — GC/CHLAMYDIA PROBE AMP
Chlamydia trachomatis, NAA: NEGATIVE
Neisseria Gonorrhoeae by PCR: NEGATIVE

## 2020-06-15 LAB — HEPATITIS B SURFACE ANTIGEN: Hepatitis B Surface Ag: NEGATIVE

## 2020-06-15 LAB — ANTIBODY SCREEN: Antibody Screen: NEGATIVE

## 2020-06-15 LAB — RUBELLA SCREEN: Rubella Antibodies, IGG: 5.22 index (ref >0.99)

## 2020-06-15 LAB — RPR: RPR Ser Ql: NONREACTIVE

## 2020-06-16 LAB — CULTURE, OB URINE

## 2020-06-16 LAB — URINE CULTURE, OB REFLEX

## 2020-07-09 ENCOUNTER — Encounter: Payer: Self-pay | Admitting: Certified Nurse Midwife

## 2020-07-09 ENCOUNTER — Other Ambulatory Visit (HOSPITAL_COMMUNITY)
Admission: RE | Admit: 2020-07-09 | Discharge: 2020-07-09 | Disposition: A | Discharge status: Home / Self Care | Payer: 59 | Source: Ambulatory Visit | Attending: Certified Nurse Midwife | Admitting: Certified Nurse Midwife

## 2020-07-09 ENCOUNTER — Other Ambulatory Visit: Payer: Self-pay

## 2020-07-09 ENCOUNTER — Ambulatory Visit (INDEPENDENT_AMBULATORY_CARE_PROVIDER_SITE_OTHER): Payer: 59 | Admitting: Certified Nurse Midwife

## 2020-07-09 VITALS — BP 103/64 | HR 70 | Wt 224.5 lb

## 2020-07-09 DIAGNOSIS — Z124 Encounter for screening for malignant neoplasm of cervix: Secondary | ICD-10-CM | POA: Diagnosis present

## 2020-07-09 DIAGNOSIS — Z3482 Encounter for supervision of other normal pregnancy, second trimester: Secondary | ICD-10-CM

## 2020-07-09 DIAGNOSIS — Z6721 Type B blood, Rh negative: Secondary | ICD-10-CM

## 2020-07-09 DIAGNOSIS — Z283 Underimmunization status: Secondary | ICD-10-CM

## 2020-07-09 DIAGNOSIS — O99891 Other specified diseases and conditions complicating pregnancy: Secondary | ICD-10-CM

## 2020-07-09 DIAGNOSIS — O34219 Maternal care for unspecified type scar from previous cesarean delivery: Secondary | ICD-10-CM

## 2020-07-09 DIAGNOSIS — Z3A13 13 weeks gestation of pregnancy: Secondary | ICD-10-CM

## 2020-07-09 DIAGNOSIS — O99210 Obesity complicating pregnancy, unspecified trimester: Secondary | ICD-10-CM

## 2020-07-09 DIAGNOSIS — Z2839 Other underimmunization status: Secondary | ICD-10-CM

## 2020-07-09 DIAGNOSIS — Z130 Encounter for screening for diseases of the blood and blood-forming organs and certain disorders involving the immune mechanism: Secondary | ICD-10-CM

## 2020-07-09 DIAGNOSIS — O09899 Supervision of other high risk pregnancies, unspecified trimester: Secondary | ICD-10-CM

## 2020-07-09 DIAGNOSIS — M549 Dorsalgia, unspecified: Secondary | ICD-10-CM

## 2020-07-09 LAB — POCT URINALYSIS DIPSTICK OB
Bilirubin, UA: NEGATIVE
Blood, UA: NEGATIVE
Glucose, UA: NEGATIVE
Ketones, UA: NEGATIVE
Leukocytes, UA: NEGATIVE
Nitrite, UA: NEGATIVE
POC,PROTEIN,UA: NEGATIVE
Spec Grav, UA: <=1.005 — AB (ref 1.010–1.025)
Urobilinogen, UA: 0.2 E.U./dL
pH, UA: 7 (ref 5.0–8.0)

## 2020-07-09 NOTE — Patient Instructions (Addendum)
Healthy Weight Gain During Pregnancy, Adult A certain amount of weight gain during pregnancy is normal and healthy. How much weight you should gain depends on your overall health and a measurement called BMI (body mass index). BMI is an estimate of your body fat based on your height and weight. You can use an online calculator to figure out your BMI, or you can ask your health care provider to calculate it for you at your next visit. Your recommended pregnancy weight gain is based on your pre-pregnancy BMI. General guidelines for a healthy total weight gain during pregnancy are listed below. If your BMI at or before the start of your pregnancy is:  Less than 18.5 (underweight), you should gain 28-40 lb (13-18 kg).  18.5-24.9 (normal weight), you should gain 25-35 lb (11-16 kg).  25-29.9 (overweight), you should gain 15-25 lb (7-11 kg).  30 or higher (obese), you should gain 11-20 lb (5-9 kg). These ranges vary depending on your individual health. If you are carrying more than one baby (multiples), it may be safe to gain more weight than these recommendations. If you gain less weight than recommended, that may be safe as long as your baby is growing and developing normally. How can unhealthy weight gain affect me and my baby? Gaining too much weight during pregnancy can lead to pregnancy complications, such as:  A temporary form of diabetes that develops during pregnancy (gestational diabetes).  High blood pressure during pregnancy and protein in your urine (preeclampsia).  High blood pressure during pregnancy without protein in your urine (gestational hypertension).  Your baby having a high weight at birth, which may: ? Raise your risk of having a more difficult delivery or a surgical delivery (cesarean delivery, or C-section). ? Raise your child's risk of developing obesity during childhood. Not gaining enough weight can be life-threatening for your baby, and it may raise your baby's chances  of:  Being born early (preterm).  Growing more slowly than normal during pregnancy (growth restriction).  Having a low weight at birth. What actions can I take to gain a healthy amount of weight during pregnancy? General instructions  Keep track of your weight gain during pregnancy.  Take over-the-counter and prescription medicines only as told by your health care provider. Take all prenatal supplements as directed.  Keep all health care visits during pregnancy (prenatal visits). These visits are a good time to discuss your weight gain. Your health care provider will weigh you at each visit to make sure you are gaining a healthy amount of weight. Nutrition   Eat a balanced, nutrient-rich diet. Eat plenty of: ? Fruits and vegetables, such as berries and broccoli. ? Whole grains, such as millet, barley, whole-wheat breads and cereals, and oatmeal. ? Low-fat dairy products or non-dairy products such as almond milk or rice milk. ? Protein foods, such as lean meat, chicken, eggs, and legumes (such as peas, beans, soybeans, and lentils).  Avoid foods that are fried or have a lot of fat, salt (sodium), or sugar.  Drink enough fluid to keep your urine pale yellow.  Choose healthy snack and drink options when you are at work or on the go: ? Drink water. Avoid soda, sports drinks, and juices that have added sugar. ? Avoid drinks with caffeine, such as coffee and energy drinks. ? Eat snacks that are high in protein, such as nuts, protein bars, and low-fat yogurt. ? Carry convenient snacks in your purse that do not need refrigeration, such as a pack of   trail mix, an apple, or a granola bar.  If you need help improving your diet, work with a health care provider or a diet and nutrition specialist (dietitian). Activity   Exercise regularly, as told by your health care provider. ? If you were active before becoming pregnant, you may be able to continue your regular fitness activities. ? If  you were not active before pregnancy, you may gradually build up to exercising for 30 or more minutes on most days of the week. This may include walking, swimming, or yoga.  Ask your health care provider what activities are safe for you. Talk with your health care provider about whether you may need to be excused from certain school or work activities. Where to find more information Learn more about managing your weight gain during pregnancy from:  American Pregnancy Association: www.americanpregnancy.org  [place] Department of Agriculture pregnancy weight gain calculator: [employer] Summary  Too much weight gain during pregnancy can lead to complications for you and your baby.  Find out your pre-pregnancy BMI to determine how much weight gain is healthy for you.  Eat nutritious foods and stay active.  Keep all of your prenatal visits as told by your health care provider. This information is not intended to replace advice given to you by your health care provider. Make sure you discuss any questions you have with your health care provider. Document Revised: 07/20/2019 Document Reviewed: 07/17/2017 Elsevier Patient Education  [place].    Exercise During Pregnancy Exercise is an important part of being healthy for people of all ages. Exercise improves the function of your heart and lungs and helps you maintain strength, flexibility, and a healthy body weight. Exercise also boosts energy levels and elevates mood. Most women should exercise regularly during pregnancy. In rare cases, women with certain medical conditions or complications may be asked to limit or avoid exercise during pregnancy. How does this affect me? Along with maintaining general strength and flexibility, exercising during pregnancy can help:  Keep strength in muscles that are used during labor and childbirth.  Decrease low back pain.  Reduce symptoms of depression.  Control weight gain during  pregnancy.  Reduce the risk of needing insulin if you develop diabetes during pregnancy.  Decrease the risk of cesarean delivery.  Speed up your recovery after giving birth. How does this affect my baby? Exercise can help you have a healthy pregnancy. Exercise does not cause premature birth. It will not cause your baby to weigh less at birth. What exercises can I do? Many exercises are safe for you to do during pregnancy. Do a variety of exercises that safely increase your heart and breathing rates and help you build and maintain muscle strength. Do exercises exactly as told by your health care provider. You may do these exercises:  Walking or hiking.  Swimming.  Water aerobics.  Riding a stationary bike.  Strength training.  Modified yoga or Pilates. Tell your instructor that you are pregnant. Avoid overstretching, and avoid lying on your back for long periods of time.  Running or jogging. Only choose this type of exercise if you: ? Ran or jogged regularly before your pregnancy. ? Can run or jog and still talk in complete sentences. What exercises should I avoid? Depending on your level of fitness and whether you exercised regularly before your pregnancy, you may be told to limit high-intensity exercise. You can tell that you are exercising at a high intensity if you are breathing much harder and faster  cannot hold a conversation while exercising. You must avoid:  Contact sports.  Activities that put you at risk for falling on or being hit in the belly, such as downhill skiing, water skiing, surfing, rock climbing, cycling, gymnastics, and horseback riding.  Scuba diving.  Skydiving.  Yoga or Pilates in a room that is heated to high temperatures.  Jogging or running, unless you ran or jogged regularly before your pregnancy. While jogging or running, you should always be able to talk in full sentences. Do not run or jog so fast that you are unable to have a  conversation.  Do not exercise at more than 6,000 feet above sea level (high elevation) if you are not used to exercising at high elevation. How do I exercise in a safe way?   Avoid overheating. Do not exercise in very high temperatures.  Wear loose-fitting, breathable clothes.  Avoid dehydration. Drink enough water before, during, and after exercise to keep your urine pale yellow.  Avoid overstretching. Because of hormone changes during pregnancy, it is easy to overstretch muscles, tendons, and ligaments during pregnancy.  Start slowly and ask your health care provider to recommend the types of exercise that are safe for you.  Do not exercise to lose weight. Follow these instructions at home:  Exercise on most days or all days of the week. Try to exercise for 30 minutes a day, 5 days a week, unless your health care provider tells you not to.  If you actively exercised before your pregnancy and you are healthy, your health care provider may tell you to continue to do moderate to high-intensity exercise.  If you are just starting to exercise or did not exercise much before your pregnancy, your health care provider may tell you to do low to moderate-intensity exercise. Questions to ask your health care provider  Is exercise safe for me?  What are signs that I should stop exercising?  Does my health condition mean that I should not exercise during pregnancy?  When should I avoid exercising during pregnancy? Stop exercising and contact a health care provider if: You have any unusual symptoms, such as:  Mild contractions of the uterus or cramps in the abdomen.  Dizziness that does not go away when you rest. Stop exercising and get help right away if: You have any unusual symptoms, such as:  Sudden, severe pain in your low back or your belly.  Mild contractions of the uterus or cramps in the abdomen that do not improve with rest and drinking fluids.  Chest pain.  Bleeding or  fluid leaking from your vagina.  Shortness of breath. These symptoms may represent a serious problem that is an emergency. Do not wait to see if the symptoms will go away. Get medical help right away. Call your local emergency services (911 in the [place] Do not drive yourself to the hospital. Summary  Most women should exercise regularly throughout pregnancy. In rare cases, women with certain medical conditions or complications may be asked to limit or avoid exercise during pregnancy.  Do not exercise to lose weight during pregnancy.  Your health care provider will tell you what level of physical activity is right for you.  Stop exercising and contact a health care provider if you have mild contractions of the uterus or cramps in the abdomen. Get help right away if these contractions or cramps do not improve with rest and drinking fluids.  Stop exercising and get help right away if you have sudden, severe   severe pain in your low back or belly, chest pain, shortness of breath, or bleeding or leaking of fluid from your vagina. This information is not intended to replace advice given to you by your health care provider. Make sure you discuss any questions you have with your health care provider. Document Revised: 02/17/2019 Document Reviewed: 12/01/2018 Elsevier Patient Education  [place].    Common Medications Safe in Pregnancy  Acne:      Constipation:  Benzoyl Peroxide     Colace  Clindamycin      Dulcolax Suppository  Topica Erythromycin     Fibercon  Salicylic Acid      Metamucil         Miralax AVOID:        Senakot   Accutane    Cough:  Retin-A       Cough Drops  Tetracycline      Phenergan w/ Codeine if Rx  Minocycline      Robitussin (Plain & DM)  Antibiotics:     Crabs/Lice:  Ceclor       RID  Cephalosporins    AVOID:  E-Mycins      Kwell  Keflex  Macrobid/Macrodantin   Diarrhea:  Penicillin      Kao-Pectate  Zithromax      Imodium AD         PUSH  FLUIDS AVOID:       Cipro     Fever:  Tetracycline      Tylenol (Regular or Extra  Minocycline       Strength)  Levaquin      Extra Strength-Do not          Exceed 8 tabs/24 hrs Caffeine:        <272m/day (equiv. To 1 cup of coffee or  approx. 3 12 oz sodas)         Gas: Cold/Hayfever:       Gas-X  Benadryl      Mylicon  Claritin       Phazyme  **Claritin-D        Chlor-Trimeton    Headaches:  Dimetapp      ASA-Free Excedrin  Drixoral-Non-Drowsy     Cold Compress  Mucinex (Guaifenasin)     Tylenol (Regular or Extra  Sudafed/Sudafed-12 Hour     Strength)  **Sudafed PE Pseudoephedrine   Tylenol Cold & Sinus     Vicks Vapor Rub  Zyrtec  **AVOID if Problems With Blood Pressure         Heartburn: Avoid lying down for at least 1 hour after meals  Aciphex      Maalox     Rash:  Milk of Magnesia     Benadryl    Mylanta       1% Hydrocortisone Cream  Pepcid  Pepcid Complete   Sleep Aids:  Prevacid      Ambien   Prilosec       Benadryl  Rolaids       Chamomile Tea  Tums (Limit 4/day)     Unisom         Tylenol PM         Warm milk-add vanilla or  Hemorrhoids:       Sugar for taste  Anusol/Anusol H.C.  (RX: Analapram 2.5%)  Sugar Substitutes:  Hydrocortisone OTC     Ok in moderation  Preparation H      Tucks        Vaseline lotion applied to tissue with  wiping    Herpes:     Throat:  Acyclovir      Oragel  Famvir  Valtrex     Vaccines:         Flu Shot Leg Cramps:       *Gardasil  Benadryl      Hepatitis A         Hepatitis B Nasal Spray:       Pneumovax  Saline Nasal Spray     Polio Booster         Tetanus Nausea:       Tuberculosis test or PPD  Vitamin B6 25 mg TID   AVOID:    Dramamine      *Gardasil  Emetrol       Live Poliovirus  Ginger Root 250 mg QID    MMR (measles, mumps &  High Complex Carbs @ Bedtime    rebella)  Sea Bands-Accupressure    Varicella (Chickenpox)  Unisom 1/2 tab TID     *No known complications           If received  before Pain:         Known pregnancy;   Darvocet       Resume series after  Lortab        Delivery  Percocet    Yeast:   Tramadol      Femstat  Tylenol 3      Gyne-lotrimin  Ultram       Monistat  Vicodin           MISC:         All Sunscreens           Hair Coloring/highlights          Insect Repellant's          (Including DEET)         Mystic Tans   Preparing for Vaginal Birth After Cesarean Delivery Vaginal birth after cesarean delivery (VBAC) is giving birth vaginally after previously delivering a baby through a cesarean section (C-section). You and your health are provider will discuss your options and whether you may be a good candidate for VBAC. What are my options? After a cesarean delivery, your options for future deliveries may include:  Scheduled repeat cesarean delivery. This is done in a hospital with an operating room.  Trial of labor after cesarean (TOLAC). A successful TOLAC results in a vaginal delivery. If it is not successful, you will need to have a cesarean delivery. TOLAC should be attempted in facilities where an emergency cesarean delivery can be performed. It should not be done as a home birth. Talk with your health care provider about the risks and benefits of each option early in your pregnancy. The best option for you will depend on your preferences and your overall health as well as your baby's. What should I know about my past cesarean delivery? It is important to know what type of incision was made in your uterus in a past cesarean delivery. The type of incision can affect the success of your TOLAC. Types of incisions include:  Low transverse. This is a side-to-side cut low on your uterus. The scar on your skin looks like a horizontal line just above your pubic area. This type of cut is the most common and makes you a good candidate for TOLAC.  Low vertical. This is an up-and-down cut low on your uterus. The scar on your skin looks like a vertical line  between  your pubic area and belly button. This type of cut puts you at higher risk for problems during TOLAC.  High vertical or classical. This is an up-and-down cut high on your uterus. The scar on your skin looks like a vertical line that runs over the top of your belly button. This type of cut has the highest risk for problems and usually means that TOLAC is not an option. When is VBAC not an option? As you progress through your pregnancy, circumstances may change and you may need to reconsider your options. Your situation may also change even as you begin TOLAC. Your health care provider may not want you to attempt a VBAC if you:  Need to have labor started (induced) because your cervix is not ready for labor.  Have never had a vaginal delivery.  Have had more than two cesarean deliveries.  Are overdue.  Are pregnant with a very large baby.  Have a condition that causes high blood pressure (preeclampsia). Questions to ask your health care provider  Am I a good candidate for TOLAC?  What are my chances of a successful vaginal delivery?  Is my preferred birth location equipped for a TOLAC?  What are my pain management options during a TOLAC? Where to find more information  American Congress of Obstetricians and Gynecologists: www.acog.[place]: www.midwife.org Summary  Vaginal birth after cesarean delivery (VBAC) is giving birth vaginally after previously delivering a baby through a cesarean section (C-section).  VBAC may be a safe and appropriate option for you depending on your medical history and other risk factors. Talk with your health care provider about the options available to you, and the risks and benefits of each early in your pregnancy.  TOLAC should be attempted in facilities where emergency cesarean section procedures can be performed. This information is not intended to replace advice given to you by your health care provider. Make  sure you discuss any questions you have with your health care provider. Document Revised: 02/22/2019 Document Reviewed: 02/05/2017 Elsevier Patient Education  [place].    Vaginal Birth After Cesarean Delivery  Vaginal birth after cesarean delivery (VBAC) is giving birth vaginally after previously delivering a baby through a cesarean section (C-section). A VBAC may be a safe option for you, depending on your health and other factors. It is important to discuss VBAC with your health care provider early in your pregnancy so you can understand the risks, benefits, and options. Having these discussions early will give you time to make your birth plan. Who are the best candidates for VBAC? The best candidates for VBAC are women who:  Have had one or two prior cesarean deliveries, and the incision made during the delivery was horizontal (low transverse).  Do not have a vertical (classical) scar on their uterus.  Have not had a tear in the wall of their uterus (uterine rupture).  Plan to have more pregnancies. A VBAC is also more likely to be successful:  In women who have previously given birth vaginally.  When labor starts by itself (spontaneously) before the due date. What are the benefits of VBAC? The benefits of delivering your baby vaginally instead of by a cesarean delivery include:  A shorter hospital stay.  A faster recovery time.  Less pain.  Avoiding risks associated with major surgery, such as infection and blood clots.  Less blood loss and less need for donated blood (transfusions). What are the risks of VBAC? The main risk of  attempting a VBAC is that it may fail, forcing your health care provider to deliver your baby by a C-section. Other risks are rare and include:  Tearing (rupture) of the scar from a past cesarean delivery.  Other risks associated with vaginal deliveries. If a repeat cesarean delivery is needed, the risks include:  Blood  loss.  Infection.  Blood clot.  Damage to surrounding organs.  Removal of the uterus (hysterectomy), if it is damaged.  Placenta problems in future pregnancies. What else should I know about my options? Delivering a baby through a VBAC is similar to having a normal spontaneous vaginal delivery. Therefore, it is safe:  To try with twins.  For your health care provider to try to turn the baby from a breech position (external cephalic version) during labor.  With epidural analgesia for pain relief. Consider where you would like to deliver your baby. VBAC should be attempted in facilities where an emergency cesarean delivery can be performed. VBAC is not recommended for home births. Any changes in your health or your baby's health during your pregnancy may make it necessary to change your initial decision about VBAC. Your health care provider may recommend that you do not attempt a VBAC if:  Your baby's suspected weight is 8.8 lb (4 kg) or more.  You have preeclampsia. This is a condition that causes high blood pressure along with other symptoms, such as swelling and headaches.  You will have VBAC less than 19 months after your cesarean delivery.  You are past your due date.  You need to have labor started (induced) because your cervix is not ready for labor (unfavorable). Where to find more information  American Pregnancy Association: americanpregnancy.org  [employer] of Obstetricians and Gynecologists: acog.org Summary  Vaginal birth after cesarean delivery (VBAC) is giving birth vaginally after previously delivering a baby through a cesarean section (C-section). A VBAC may be a safe option for you, depending on your health and other factors.  Discuss VBAC with your health care provider early in your pregnancy so you can understand the risks, benefits, options, and have plenty of time to make your birth plan.  The main risk of attempting a VBAC is that it may fail,  forcing your health care provider to deliver your baby by a C-section. Other risks are rare. This information is not intended to replace advice given to you by your health care provider. Make sure you discuss any questions you have with your health care provider. Document Revised: 02/22/2019 Document Reviewed: 02/03/2017 Elsevier Patient Education  [place].    WHAT OB PATIENTS CAN EXPECT   Confirmation of pregnancy and ultrasound ordered if medically indicated-[redacted] weeks gestation  New OB (NOB) intake with nurse and New OB (NOB) labs- [redacted] weeks gestation  New OB (NOB) physical examination with provider- 11/[redacted] weeks gestation  Flu vaccine-[redacted] weeks gestation  Anatomy scan-[redacted] weeks gestation  Glucose tolerance test, blood work to test for anemia, T-dap vaccine-[redacted] weeks gestation  Vaginal swabs/cultures-STD/Group B strep-[redacted] weeks gestation  Appointments every 4 weeks until 28 weeks  Every 2 weeks from 28 weeks until 36 weeks  Weekly visits from 36 weeks until delivery    Second Trimester of Pregnancy  The second trimester is from week 14 through week 27 (month 4 through 6). This is often the time in pregnancy that you feel your best. Often times, morning sickness has lessened or quit. You may have more energy, and you may get hungry more often. Your unborn  baby is growing rapidly. At the end of the sixth month, he or she is about 9 inches long and weighs about 1 pounds. You will likely feel the baby move between 18 and 20 weeks of pregnancy. Follow these instructions at home: Medicines  Take over-the-counter and prescription medicines only as told by your doctor. Some medicines are safe and some medicines are not safe during pregnancy.  Take a prenatal vitamin that contains at least 600 micrograms (mcg) of folic acid.  If you have trouble pooping (constipation), take medicine that will make your stool soft (stool softener) if your doctor approves. Eating and  drinking   Eat regular, healthy meals.  Avoid raw meat and uncooked cheese.  If you get low calcium from the food you eat, talk to your doctor about taking a daily calcium supplement.  Avoid foods that are high in fat and sugars, such as fried and sweet foods.  If you feel sick to your stomach (nauseous) or throw up (vomit): ? Eat 4 or 5 small meals a day instead of 3 large meals. ? Try eating a few soda crackers. ? Drink liquids between meals instead of during meals.  To prevent constipation: ? Eat foods that are high in fiber, like fresh fruits and vegetables, whole grains, and beans. ? Drink enough fluids to keep your pee (urine) clear or pale yellow. Activity  Exercise only as told by your doctor. Stop exercising if you start to have cramps.  Do not exercise if it is too hot, too humid, or if you are in a place of great height (high altitude).  Avoid heavy lifting.  Wear low-heeled shoes. Sit and stand up straight.  You can continue to have sex unless your doctor tells you not to. Relieving pain and discomfort  Wear a good support bra if your breasts are tender.  Take warm water baths (sitz baths) to soothe pain or discomfort caused by hemorrhoids. Use hemorrhoid cream if your doctor approves.  Rest with your legs raised if you have leg cramps or low back pain.  If you develop puffy, bulging veins (varicose veins) in your legs: ? Wear support hose or compression stockings as told by your doctor. ? Raise (elevate) your feet for 15 minutes, 3-4 times a day. ? Limit salt in your food. Prenatal care  Write down your questions. Take them to your prenatal visits.  Keep all your prenatal visits as told by your doctor. This is important. Safety  Wear your seat belt when driving.  Make a list of emergency phone numbers, including numbers for family, friends, the hospital, and police and fire departments. General instructions  Ask your doctor about the right foods to  eat or for help finding a counselor, if you need these services.  Ask your doctor about local prenatal classes. Begin classes before month 6 of your pregnancy.  Do not use hot tubs, steam rooms, or saunas.  Do not douche or use tampons or scented sanitary pads.  Do not cross your legs for long periods of time.  Visit your dentist if you have not done so. Use a soft toothbrush to brush your teeth. Floss gently.  Avoid all smoking, herbs, and alcohol. Avoid drugs that are not approved by your doctor.  Do not use any products that contain nicotine or tobacco, such as cigarettes and e-cigarettes. If you need help quitting, ask your doctor.  Avoid cat litter boxes and soil used by cats. These carry germs that can cause birth  defects in the baby and can cause a loss of your baby (miscarriage) or stillbirth. Contact a doctor if:  You have mild cramps or pressure in your lower belly.  You have pain when you pee (urinate).  You have bad smelling fluid coming from your vagina.  You continue to feel sick to your stomach (nauseous), throw up (vomit), or have watery poop (diarrhea).  You have a nagging pain in your belly area.  You feel dizzy. Get help right away if:  You have a fever.  You are leaking fluid from your vagina.  You have spotting or bleeding from your vagina.  You have severe belly cramping or pain.  You lose or gain weight rapidly.  You have trouble catching your breath and have chest pain.  You notice sudden or extreme puffiness (swelling) of your face, hands, ankles, feet, or legs.  You have not felt the baby move in over an hour.  You have severe headaches that do not go away when you take medicine.  You have trouble seeing. Summary  The second trimester is from week 14 through week 27 (months 4 through 6). This is often the time in pregnancy that you feel your best.  To take care of yourself and your unborn baby, you will need to eat healthy meals, take  medicines only if your doctor tells you to do so, and do activities that are safe for you and your baby.  Call your doctor if you get sick or if you notice anything unusual about your pregnancy. Also, call your doctor if you need help with the right food to eat, or if you want to know what activities are safe for you. This information is not intended to replace advice given to you by your health care provider. Make sure you discuss any questions you have with your health care provider. Document Revised: 02/18/2019 Document Reviewed: 12/02/2016 Elsevier Patient Education  [place].  Back Pain in Pregnancy Back pain during pregnancy is common. Back pain may be caused by several factors that are related to changes during your pregnancy. Follow these instructions at home: Managing pain, stiffness, and swelling      If directed, for sudden (acute) back pain, put ice on the painful area. ? Put ice in a plastic bag. ? Place a towel between your skin and the bag. ? Leave the ice on for 20 minutes, 2-3 times per day.  If directed, apply heat to the affected area before you exercise. Use the heat source that your health care provider recommends, such as a moist heat pack or a heating pad. ? Place a towel between your skin and the heat source. ? Leave the heat on for 20-30 minutes. ? Remove the heat if your skin turns bright red. This is especially important if you are unable to feel pain, heat, or cold. You may have a greater risk of getting burned.  If directed, massage the affected area. Activity  Exercise as told by your health care provider. Gentle exercise is the best way to prevent or manage back pain.  Listen to your body when lifting. If lifting hurts, ask for help or bend your knees. This uses your leg muscles instead of your back muscles.  Squat down when picking up something from the floor. Do not bend over.  Only use bed rest for short periods as told by your health care  provider. Bed rest should only be used for the most severe episodes of back  pain. Standing, sitting, and lying down  Do not stand in one place for long periods of time.  Use good posture when sitting. Make sure your head rests over your shoulders and is not hanging forward. Use a pillow on your lower back if necessary.  Try sleeping on your side, preferably the left side, with a pregnancy support pillow or 1-2 regular pillows between your legs. ? If you have back pain after a night's rest, your bed may be too soft. ? A firm mattress may provide more support for your back during pregnancy. General instructions  Do not wear high heels.  Eat a healthy diet. Try to gain weight within your health care provider's recommendations.  Use a maternity girdle, elastic sling, or back brace as told by your health care provider.  Take over-the-counter and prescription medicines only as told by your health care provider.  Work with a physical therapist or massage therapist to find ways to manage back pain. Acupuncture or massage therapy may be helpful.  Keep all follow-up visits as told by your health care provider. This is important. Contact a health care provider if:  Your back pain interferes with your daily activities.  You have increasing pain in other parts of your body. Get help right away if:  You develop numbness, tingling, weakness, or problems with the use of your arms or legs.  You develop severe back pain that is not controlled with medicine.  You have a change in bowel or bladder control.  You develop shortness of breath, dizziness, or you faint.  You develop nausea, vomiting, or sweating.  You have back pain that is a rhythmic, cramping pain similar to labor pains. Labor pain is usually 1-2 minutes apart, lasts for about 1 minute, and involves a bearing down feeling or pressure in your pelvis.  You have back pain and your water breaks or you have vaginal bleeding.  You have  back pain or numbness that travels down your leg.  Your back pain developed after you fell.  You develop pain on one side of your back.  You see blood in your urine.  You develop skin blisters in the area of your back pain. Summary  Back pain may be caused by several factors that are related to changes during your pregnancy.  Follow instructions as told by your health care provider for managing pain, stiffness, and swelling.  Exercise as told by your health care provider. Gentle exercise is the best way to prevent or manage back pain.  Take over-the-counter and prescription medicines only as told by your health care provider.  Keep all follow-up visits as told by your health care provider. This is important. This information is not intended to replace advice given to you by your health care provider. Make sure you discuss any questions you have with your health care provider. Document Revised: 02/15/2019 Document Reviewed: 04/14/2018 Elsevier Patient Education  [place].

## 2020-07-09 NOTE — Progress Notes (Signed)
NEW OB HISTORY AND PHYSICAL  SUBJECTIVE:       Vanessa Mullins is a 24 y.o. G53P1001 female, Patient's last menstrual period was 04/09/2020., Estimated Date of Delivery: 01/14/21, [redacted]w[redacted]d, presents today for establishment of Prenatal Care.  Reports improving nausea and fatigue. Endorses back pain the worsens with sitting.   Denies difficulty breathing or respiratory distress, chest pain, abdominal pain, vaginal bleeding, dysuria, and leg pain or swelling.   History significant for cesarean section due to breech presentation, desires vaginal birth.   Declines genetic screening. Prefers midwifery care.    Gynecologic History  Patient's last menstrual period was 04/09/2020.   Contraception: none   Last Pap: 03/2017. Results were: normal  Obstetric History  OB History  Gravida Para Term Preterm AB Living  2 1 1     1   SAB TAB Ectopic Multiple Live Births          1    # Outcome Date GA Lbr Len/2nd Weight Sex Delivery Anes PTL Lv  2 Current           1 Term 06/05/14   6 lb 7 oz (2.92 kg) M CS-Unspec Spinal  LIV    Past Medical History:  Diagnosis Date  . Asthma   . Inguinal hernia     Past Surgical History:  Procedure Laterality Date  . CESAREAN SECTION      Current Outpatient Medications on File Prior to Visit  Medication Sig Dispense Refill  . ondansetron (ZOFRAN ODT) 4 MG disintegrating tablet Take 1 tablet (4 mg total) by mouth every 6 (six) hours as needed for nausea. 20 tablet 0  . ondansetron (ZOFRAN) 8 MG tablet Take by mouth.    . Prenatal Vit-Fe Fumarate-FA (MULTIVITAMIN-PRENATAL) 27-0.8 MG TABS tablet Take 1 tablet by mouth daily at 12 noon.     No current facility-administered medications on file prior to visit.    Allergies  Allergen Reactions  . Amoxicillin Rash    Social History   Socioeconomic History  . Marital status: Married    Spouse name: Not on file  . Number of children: Not on file  . Years of education: Not on file  . Highest  education level: Not on file  Occupational History  . Not on file  Tobacco Use  . Smoking status: Never Smoker  . Smokeless tobacco: Never Used  Vaping Use  . Vaping Use: Never used  Substance and Sexual Activity  . Alcohol use: No  . Drug use: No  . Sexual activity: Yes    Birth control/protection: None  Other Topics Concern  . Not on file  Social History Narrative  . Not on file   Social Determinants of Health   Financial Resource Strain:   . Difficulty of Paying Living Expenses: Not on file  Food Insecurity:   . Worried About 06/07/14 in the Last Year: Not on file  . Ran Out of Food in the Last Year: Not on file  Transportation Needs:   . Lack of Transportation (Medical): Not on file  . Lack of Transportation (Non-Medical): Not on file  Physical Activity:   . Days of Exercise per Week: Not on file  . Minutes of Exercise per Session: Not on file  Stress:   . Feeling of Stress : Not on file  Social Connections:   . Frequency of Communication with Friends and Family: Not on file  . Frequency of Social Gatherings with Friends and Family: Not on file  .  Attends Religious Services: Not on file  . Active Member of Clubs or Organizations: Not on file  . Attends Banker Meetings: Not on file  . Marital Status: Not on file  Intimate Partner Violence:   . Fear of Current or Ex-Partner: Not on file  . Emotionally Abused: Not on file  . Physically Abused: Not on file  . Sexually Abused: Not on file    Family History  Problem Relation Age of Onset  . Hypertension Mother   . Hypertension Father   . Diabetes Maternal Grandfather   . Cancer Paternal Grandmother        lung cancer    The following portions of the patient's history were reviewed and updated as appropriate: allergies, current medications, past OB history, past medical history, past surgical history, past family history, past social history, and problem list.    OBJECTIVE:  BP  103/64   Pulse 70   Wt 224 lb 8 oz (101.8 kg)   LMP 04/09/2020   BMI 37.36 kg/m   Initial Physical Exam (New OB)  GENERAL APPEARANCE: alert, well appearing, in no apparent distress  HEAD: normocephalic, atraumatic  MOUTH: mucous membranes moist, pharynx normal without lesions  THYROID: no thyromegaly or masses present  BREASTS: no masses noted, no significant tenderness, no palpable axillary nodes, no skin changes  LUNGS: clear to auscultation, no wheezes, rales or rhonchi, symmetric air entry  HEART: regular rate and rhythm, no murmurs  ABDOMEN: soft, nontender, nondistended, no abnormal masses, no epigastric pain, obese and FHT present  EXTREMITIES: no redness or tenderness in the calves or thighs, no edema  SKIN: normal coloration and turgor, no rashes  LYMPH NODES: no adenopathy palpable  NEUROLOGIC: alert, oriented, normal speech, no focal findings or movement disorder noted  PELVIC EXAM EXTERNAL GENITALIA: normal appearing vulva with no masses, tenderness or lesions VAGINA: no abnormal discharge or lesions CERVIX: no lesions or cervical motion tenderness and Pap collected  ASSESSMENT: Normal pregnancy Rh negative Obesity in pregnancy Screening cervical cancer Screening anemia Declines genetic screening  Desires vaginal birth after cesarean section for breech presentation  PLAN: Prenatal care New OB counseling: The patient has been given an overview regarding routine prenatal care. Recommendations regarding diet, weight gain, and exercise in pregnancy were given. Prenatal testing, optional genetic testing, and ultrasound use in pregnancy were reviewed.  Benefits of Breast Feeding were discussed. The patient is encouraged to consider nursing her baby post partum. See orders

## 2020-07-10 LAB — CBC
Hematocrit: 36.7 % (ref 34.0–46.6)
Hemoglobin: 12.5 g/dL (ref 11.1–15.9)
MCH: 28.8 pg (ref 26.6–33.0)
MCHC: 34.1 g/dL (ref 31.5–35.7)
MCV: 85 fL (ref 79–97)
Platelets: 227 10*3/uL (ref 150–450)
RBC: 4.34 x10E6/uL (ref 3.77–5.28)
RDW: 12 % (ref 11.7–15.4)
WBC: 8 10*3/uL (ref 3.4–10.8)

## 2020-07-11 LAB — CYTOLOGY - PAP: Diagnosis: NEGATIVE

## 2020-07-19 ENCOUNTER — Inpatient Hospital Stay (HOSPITAL_COMMUNITY)
Admission: AD | Admit: 2020-07-19 | Discharge: 2020-07-20 | Disposition: A | Discharge status: Home / Self Care | Payer: 59 | Attending: Obstetrics and Gynecology | Admitting: Obstetrics and Gynecology

## 2020-07-19 ENCOUNTER — Encounter (HOSPITAL_COMMUNITY): Payer: Self-pay | Admitting: Obstetrics and Gynecology

## 2020-07-19 ENCOUNTER — Other Ambulatory Visit: Payer: Self-pay

## 2020-07-19 DIAGNOSIS — O99512 Diseases of the respiratory system complicating pregnancy, second trimester: Secondary | ICD-10-CM | POA: Insufficient documentation

## 2020-07-19 DIAGNOSIS — R55 Syncope and collapse: Secondary | ICD-10-CM | POA: Diagnosis not present

## 2020-07-19 DIAGNOSIS — O26892 Other specified pregnancy related conditions, second trimester: Secondary | ICD-10-CM | POA: Diagnosis not present

## 2020-07-19 DIAGNOSIS — R42 Dizziness and giddiness: Secondary | ICD-10-CM | POA: Diagnosis not present

## 2020-07-19 DIAGNOSIS — Z3A14 14 weeks gestation of pregnancy: Secondary | ICD-10-CM

## 2020-07-19 DIAGNOSIS — J45909 Unspecified asthma, uncomplicated: Secondary | ICD-10-CM | POA: Diagnosis not present

## 2020-07-19 DIAGNOSIS — Z79899 Other long term (current) drug therapy: Secondary | ICD-10-CM | POA: Diagnosis not present

## 2020-07-19 LAB — URINALYSIS, ROUTINE W REFLEX MICROSCOPIC
Bilirubin Urine: NEGATIVE
Glucose, UA: NEGATIVE mg/dL
Hgb urine dipstick: NEGATIVE
Ketones, ur: NEGATIVE mg/dL
Leukocytes,Ua: NEGATIVE
Nitrite: NEGATIVE
Protein, ur: NEGATIVE mg/dL
Specific Gravity, Urine: 1.018 (ref 1.005–1.030)
pH: 5 (ref 5.0–8.0)

## 2020-07-19 MED ORDER — PROMETHAZINE HCL 25 MG PO TABS
25.0000 mg | ORAL_TABLET | ORAL | Status: AC
  Administered 2020-07-19: 25 mg via ORAL
  Filled 2020-07-19: qty 1

## 2020-07-19 NOTE — MAU Provider Note (Signed)
History     CSN: [id number]  Arrival date and time: 07/19/20 2151   None     Chief Complaint  Patient presents with  . Dizziness    pt reports dizziness is decereased from previously at home   HPI   Patient is a g2p1001 at [redacted]w[redacted]d who presents via EMS for dizziness experienced at home. Patient reports that she felt very fatigued when she got home from work. She continued her normal routine and had dinner. She started noticing some fuzzy vision in the outer quadrant of her right eye and took some tylenol. She went to the bathroom and was having a bowel movement when she felt lightheaded and dizzy, and had difficulty with reading/comprehending. She subsequently attempted to call 911 but had a hard time dialing the phone number, husband assisted. She reports that she thinks the whole episode lasted about 10-15 minutes, started feeling improved by the time EMS arrived and was able to ambulate upon arrival to MAU.   She reports feeling improved upon interview. She is a little dizzy still and feeling very tired and stressed from her experience. Her vision is back to normal and she is able to walk around without issue. She never had a headache with this event. She says she has had a few migraines in her life and when she has them she always has the blurry right sided vision. She initially thought this was a migraine. She denies any other associated symptoms - denies fevers, chills, vomiting, SOB, chest pain. She says she has been eating and drinking normally today.   She has a history of occasional migraines with aura. She has no prior history of blood clots.      OB History    Gravida  2   Para  1   Term  1   Preterm      AB      Living  1     SAB      TAB      Ectopic      Multiple      Live Births  1           Past Medical History:  Diagnosis Date  . Asthma   . Inguinal hernia     Past Surgical History:  Procedure Laterality Date  . CESAREAN SECTION      Family  History  Problem Relation Age of Onset  . Hypertension Mother   . Hypertension Father   . Diabetes Maternal Grandfather   . Cancer Paternal Grandmother        lung cancer    Social History   Tobacco Use  . Smoking status: Never Smoker  . Smokeless tobacco: Never Used  Vaping Use  . Vaping Use: Never used  Substance Use Topics  . Alcohol use: No  . Drug use: No    Allergies:  Allergies  Allergen Reactions  . Amoxicillin Anaphylaxis and Rash    Medications Prior to Admission  Medication Sig Dispense Refill Last Dose  . acetaminophen (TYLENOL) 500 MG tablet Take 500 mg by mouth every 6 (six) hours as needed.   07/19/2020 at 2020  . ondansetron (ZOFRAN ODT) 4 MG disintegrating tablet Take 1 tablet (4 mg total) by mouth every 6 (six) hours as needed for nausea. 20 tablet 0 07/19/2020 at 1500  . Prenatal Vit-Fe Fumarate-FA (MULTIVITAMIN-PRENATAL) 27-0.8 MG TABS tablet Take 1 tablet by mouth daily at 12 noon.   07/19/2020 at 1000    Review of  Systems  Constitutional: Positive for fatigue. Negative for appetite change, chills and fever.  Respiratory: Negative for cough, chest tightness and shortness of breath.   Cardiovascular: Negative for chest pain.  Gastrointestinal: Negative for abdominal pain and vomiting.   Physical Exam   Last menstrual period 04/09/2020.  Physical Exam Constitutional:      Appearance: Normal appearance.  HENT:     Head: Normocephalic and atraumatic.     Right Ear: Tympanic membrane normal.     Left Ear: Tympanic membrane normal.     Nose: Nose normal.  Eyes:     Extraocular Movements: Extraocular movements intact.     Conjunctiva/sclera: Conjunctivae normal.     Pupils: Pupils are equal, round, and reactive to light.  Cardiovascular:     Rate and Rhythm: Normal rate and regular rhythm.  Pulmonary:     Effort: Pulmonary effort is normal.  Abdominal:     Palpations: Abdomen is soft. There is no mass.     Tenderness: There is no abdominal  tenderness.  Skin:    General: Skin is warm and dry.  Neurological:     General: No focal deficit present.     Mental Status: She is alert and oriented to person, place, and time. Mental status is at baseline.     Cranial Nerves: No cranial nerve deficit.     Motor: No weakness.     Gait: Gait normal.  Psychiatric:        Mood and Affect: Mood normal.        Behavior: Behavior normal.     MAU Course  Procedures  MDM Patient examined at bedside, reports feeling improved since episode Orthostatic VS appropriate (121/68 supine, 114/63 sitting, 125/70 standing). HR stable.  UA normal, no ketones, neg nitrites/LE  Phenergan PO x1 administered for nausea/dizziness  Pt feeling improved.   Assessment and Plan   Vagal Episode -overall favor vagal episode given timing of occurring while on toilet. Also consider atypical migraine with aura. Discussed with patient.  -UA normal  -encouraged rest, hydration, close f/u w OB  -discussed strict return precautions -dc from MAU  Gita Kudo 07/19/2020, 10:37 PM

## 2020-07-19 NOTE — MAU Note (Signed)
Pt denies uterine cramping or contractions. Reports "flutters", wondered if it was the baby moving. Denies vaginal bleeding or discharge.

## 2020-07-19 NOTE — MAU Note (Signed)
Pt is G2P1 with an EDC of 03/17/2021 that arrived via EMS-ambulated to room . Pt reports feeling extreme fatigue when arriving at home from work this evening. Started cooking dinner at 1945 and began feeling dizzy and had loss of vision in her R eye. Pt denies HA and reported no HA at that time. Pt says she became increasingly concerned because as her vision returned it was blurry but she was unable to verbalize or recognize what she was looking at. "I was putting on my socks and could not think of what they were called." Pt states she has had 3 migraines in her life time. Pt denies recent or known covid exposure and is not vaccinated.

## 2020-07-20 DIAGNOSIS — O26892 Other specified pregnancy related conditions, second trimester: Secondary | ICD-10-CM

## 2020-07-20 DIAGNOSIS — R42 Dizziness and giddiness: Secondary | ICD-10-CM

## 2020-07-20 DIAGNOSIS — Z3A14 14 weeks gestation of pregnancy: Secondary | ICD-10-CM

## 2020-07-20 NOTE — MAU Note (Signed)
Paper AVS signed

## 2020-07-20 NOTE — Discharge Instructions (Signed)
Near-Syncope Near-syncope is when you suddenly get weak or dizzy, or you feel like you might pass out (faint). This may also be called presyncope. This is due to a lack of blood flow to the brain. During an episode of near-syncope, you may:  Feel dizzy, weak, or light-headed.  Feel sick to your stomach (nauseous).  See all white or all black.  See spots.  Have cold, clammy skin. This condition is caused by a sudden decrease in blood flow to the brain. This decrease can result from various causes, but most of those causes are not dangerous. However, near-syncope may be a sign of a serious medical problem, so it is important to seek medical care. Follow these instructions at home: Medicines  Take over-the-counter and prescription medicines only as told by your doctor.  If you are taking blood pressure or heart medicine, get up slowly and spend many minutes getting ready to sit and then stand. This can help with dizziness. General instructions  Be aware of any changes in your symptoms.  Talk with your doctor about your symptoms. You may need to have testing to find the cause of your near-syncope.  If you start to feel like you might pass out, lie down right away. Raise (elevate) your feet above the level of your heart. Breathe deeply and steadily. Wait until all of the symptoms are gone.  Have someone stay with you until you feel stable.  Do not drive, use machinery, or play sports until your doctor says it is okay.  Drink enough fluid to keep your pee (urine) pale yellow.  Keep all follow-up visits as told by your doctor. This is important. Get help right away if you:  Have a seizure.  Have pain in your: ? Chest. ? Belly (abdomen). ? Back.  Faint once or more than once.  Have a very bad headache.  Are bleeding from your mouth or butt.  Have black or tarry poop (stool).  Have a very fast or uneven heartbeat (palpitations).  Have trouble walking.  Are very  weak.  Have trouble seeing. These symptoms may be an emergency. Do not wait to see if the symptoms will go away. Get medical help right away. Call your local emergency services (911 in the [place] Do not drive yourself to the hospital. Summary  Near-syncope is when you suddenly get weak or dizzy, or you feel like you might pass out (faint).  This condition is caused by a lack of blood flow to the brain. This information is not intended to replace advice given to you by your health care provider. Make sure you discuss any questions you have with your health care provider. Document Revised: 02/18/2019 Document Reviewed: 09/15/2018 Elsevier Patient Education  2020 [employer].

## 2020-07-20 NOTE — MAU Note (Signed)
MD in to speak with pt-discharge instructions given and work note. Pt reports dizziness resolved, "I am just really tired." Pt denies visual changes or loss of vision at this time. Pt A&O x 4. States "I feel pretty normal." Pt will follow up with her OB provider in the am and return to MAU tonight if symptoms return or any further emergent concerns.

## 2020-08-06 ENCOUNTER — Other Ambulatory Visit: Payer: Self-pay

## 2020-08-06 ENCOUNTER — Ambulatory Visit (INDEPENDENT_AMBULATORY_CARE_PROVIDER_SITE_OTHER): Payer: 59 | Admitting: Certified Nurse Midwife

## 2020-08-06 VITALS — BP 111/61 | HR 82 | Wt 223.4 lb

## 2020-08-06 DIAGNOSIS — Z3A17 17 weeks gestation of pregnancy: Secondary | ICD-10-CM

## 2020-08-06 LAB — POCT URINALYSIS DIPSTICK OB
Bilirubin, UA: NEGATIVE
Blood, UA: NEGATIVE
Glucose, UA: NEGATIVE
Ketones, UA: NEGATIVE
Leukocytes, UA: NEGATIVE
Nitrite, UA: NEGATIVE
POC,PROTEIN,UA: NEGATIVE
Spec Grav, UA: 1.025 (ref 1.010–1.025)
Urobilinogen, UA: 0.2 E.U./dL
pH, UA: 5 (ref 5.0–8.0)

## 2020-08-06 NOTE — Progress Notes (Signed)
ROB doing well. Feels fluttering. C/o a symcopal episode that she went to the ED for evaluation. Discussed increased blood volume and orthostatic hypertension. Encouraged slowly getting up , or not standing for extedned periods. Encouraged PO hydration. She verbalizes and agrees. Follow up in 3 wk for u/s and ROB with Marcelino Duster.   Doreene Burke, CNM

## 2020-08-06 NOTE — Patient Instructions (Signed)

## 2020-09-06 ENCOUNTER — Encounter: Payer: Self-pay | Admitting: Certified Nurse Midwife

## 2020-09-06 ENCOUNTER — Ambulatory Visit (INDEPENDENT_AMBULATORY_CARE_PROVIDER_SITE_OTHER): Payer: 59

## 2020-09-06 ENCOUNTER — Other Ambulatory Visit: Payer: Self-pay

## 2020-09-06 ENCOUNTER — Ambulatory Visit (INDEPENDENT_AMBULATORY_CARE_PROVIDER_SITE_OTHER): Payer: 59 | Admitting: Certified Nurse Midwife

## 2020-09-06 VITALS — BP 117/68 | HR 88 | Wt 229.3 lb

## 2020-09-06 DIAGNOSIS — Z3A21 21 weeks gestation of pregnancy: Secondary | ICD-10-CM

## 2020-09-06 DIAGNOSIS — Z3687 Encounter for antenatal screening for uncertain dates: Secondary | ICD-10-CM | POA: Diagnosis not present

## 2020-09-06 DIAGNOSIS — Z3A17 17 weeks gestation of pregnancy: Secondary | ICD-10-CM

## 2020-09-06 DIAGNOSIS — Z3482 Encounter for supervision of other normal pregnancy, second trimester: Secondary | ICD-10-CM

## 2020-09-06 LAB — POCT URINALYSIS DIPSTICK OB
Bilirubin, UA: NEGATIVE
Blood, UA: NEGATIVE
Glucose, UA: NEGATIVE
Ketones, UA: NEGATIVE
Leukocytes, UA: NEGATIVE
Nitrite, UA: NEGATIVE
POC,PROTEIN,UA: NEGATIVE
Spec Grav, UA: 1.025 (ref 1.010–1.025)
Urobilinogen, UA: 0.2 E.U./dL
pH, UA: 6 (ref 5.0–8.0)

## 2020-09-06 MED ORDER — TRIAMCINOLONE ACETONIDE 0.1 % EX CREA: 1.0000 "application " | TOPICAL_CREAM | Freq: Two times a day (BID) | CUTANEOUS | 0 refills | Status: DC

## 2020-09-06 NOTE — Progress Notes (Signed)
ROB-Accompanied by husband. Reports itchy, red rash to left breast size of penny. Rx Triamcinolone, see orders. Anatomy scan complete and normal; see below. Anticipatory guidance regarding course of prenatal care. Reviewed red flag symptoms and when to call. RTC x 4 weeks for ROB or sooner if needed.   ULTRASOUND REPORT  Location: [facility] Date of Service: 09/06/2020   Indications:Anatomy Ultrasound Findings:  Singleton intrauterine pregnancy is visualized with FHR at 145 BPM. Biometrics give an (U/S) Gestational age of [redacted]w[redacted]d and an (U/S) EDD of 01/13/2021; this correlates with the clinically established Estimated Date of Delivery: 01/14/21  Fetal presentation is transverse.  EFW: 437 g ( 15 oz).  Placenta: fundal. Grade: 1 AFI: subjectively normal.  Anatomic survey is complete and normal; Gender - female.    Right Ovary is normal in appearance. Left Ovary is normal appearance. Survey of the adnexa demonstrates no adnexal masses. There is no free peritoneal fluid in the cul de sac.  Impression: 1. [redacted]w[redacted]d Viable Singleton Intrauterine pregnancy by U/S. 2. (U/S) EDD is consistent with Clinically established Estimated Date of Delivery: 01/14/21 . 3. Normal Anatomy Scan  Recommendations: 1.Clinical correlation with the patient's History and Physical Exam.

## 2020-09-06 NOTE — Patient Instructions (Addendum)
Back Pain in Pregnancy Back pain during pregnancy is common. Back pain may be caused by several factors that are related to changes during your pregnancy. Follow these instructions at home: Managing pain, stiffness, and swelling      If directed, for sudden (acute) back pain, put ice on the painful area. ? Put ice in a plastic bag. ? Place a towel between your skin and the bag. ? Leave the ice on for 20 minutes, 2-3 times per day.  If directed, apply heat to the affected area before you exercise. Use the heat source that your health care provider recommends, such as a moist heat pack or a heating pad. ? Place a towel between your skin and the heat source. ? Leave the heat on for 20-30 minutes. ? Remove the heat if your skin turns bright red. This is especially important if you are unable to feel pain, heat, or cold. You may have a greater risk of getting burned.  If directed, massage the affected area. Activity  Exercise as told by your health care provider. Gentle exercise is the best way to prevent or manage back pain.  Listen to your body when lifting. If lifting hurts, ask for help or bend your knees. This uses your leg muscles instead of your back muscles.  Squat down when picking up something from the floor. Do not bend over.  Only use bed rest for short periods as told by your health care provider. Bed rest should only be used for the most severe episodes of back pain. Standing, sitting, and lying down  Do not stand in one place for long periods of time.  Use good posture when sitting. Make sure your head rests over your shoulders and is not hanging forward. Use a pillow on your lower back if necessary.  Try sleeping on your side, preferably the left side, with a pregnancy support pillow or 1-2 regular pillows between your legs. ? If you have back pain after a night's rest, your bed may be too soft. ? A firm mattress may provide more support for your back during  pregnancy. General instructions  Do not wear high heels.  Eat a healthy diet. Try to gain weight within your health care provider's recommendations.  Use a maternity girdle, elastic sling, or back brace as told by your health care provider.  Take over-the-counter and prescription medicines only as told by your health care provider.  Work with a physical therapist or massage therapist to find ways to manage back pain. Acupuncture or massage therapy may be helpful.  Keep all follow-up visits as told by your health care provider. This is important. Contact a health care provider if:  Your back pain interferes with your daily activities.  You have increasing pain in other parts of your body. Get help right away if:  You develop numbness, tingling, weakness, or problems with the use of your arms or legs.  You develop severe back pain that is not controlled with medicine.  You have a change in bowel or bladder control.  You develop shortness of breath, dizziness, or you faint.  You develop nausea, vomiting, or sweating.  You have back pain that is a rhythmic, cramping pain similar to labor pains. Labor pain is usually 1-2 minutes apart, lasts for about 1 minute, and involves a bearing down feeling or pressure in your pelvis.  You have back pain and your water breaks or you have vaginal bleeding.  You have back pain or numbness  that travels down your leg.  Your back pain developed after you fell.  You develop pain on one side of your back.  You see blood in your urine.  You develop skin blisters in the area of your back pain. Summary  Back pain may be caused by several factors that are related to changes during your pregnancy.  Follow instructions as told by your health care provider for managing pain, stiffness, and swelling.  Exercise as told by your health care provider. Gentle exercise is the best way to prevent or manage back pain.  Take over-the-counter and  prescription medicines only as told by your health care provider.  Keep all follow-up visits as told by your health care provider. This is important. This information is not intended to replace advice given to you by your health care provider. Make sure you discuss any questions you have with your health care provider. Document Revised: 02/15/2019 Document Reviewed: 04/14/2018 Elsevier Patient Education  [place].   Round Ligament Pain  The round ligament is a cord of muscle and tissue that helps support the uterus. It can become a source of pain during pregnancy if it becomes stretched or twisted as the baby grows. The pain usually begins in the second trimester (13-28 weeks) of pregnancy, and it can come and go until the baby is delivered. It is not a serious problem, and it does not cause harm to the baby. Round ligament pain is usually a short, sharp, and pinching pain, but it can also be a dull, lingering, and aching pain. The pain is felt in the lower side of the abdomen or in the groin. It usually starts deep in the groin and moves up to the outside of the hip area. The pain may occur when you:  Suddenly change position, such as quickly going from a sitting to standing position.  Roll over in bed.  Cough or sneeze.  Do physical activity. Follow these instructions at home:   Watch your condition for any changes.  When the pain starts, relax. Then try any of these methods to help with the pain: ? Sitting down. ? Flexing your knees up to your abdomen. ? Lying on your side with one pillow under your abdomen and another pillow between your legs. ? Sitting in a warm bath for 15-20 minutes or until the pain goes away.  Take over-the-counter and prescription medicines only as told by your health care provider.  Move slowly when you sit down or stand up.  Avoid long walks if they cause pain.  Stop or reduce your physical activities if they cause pain.  Keep all  follow-up visits as told by your health care provider. This is important. Contact a health care provider if:  Your pain does not go away with treatment.  You feel pain in your back that you did not have before.  Your medicine is not helping. Get help right away if:  You have a fever or chills.  You develop uterine contractions.  You have vaginal bleeding.  You have nausea or vomiting.  You have diarrhea.  You have pain when you urinate. Summary  Round ligament pain is felt in the lower abdomen or groin. It is usually a short, sharp, and pinching pain. It can also be a dull, lingering, and aching pain.  This pain usually begins in the second trimester (13-28 weeks). It occurs because the uterus is stretching with the growing baby, and it is not harmful to the  baby.  Dennis Bast may notice the pain when you suddenly change position, when you cough or sneeze, or during physical activity.  Relaxing, flexing your knees to your abdomen, lying on one side, or taking a warm bath may help to get rid of the pain.  Get help from your health care provider if the pain does not go away or if you have vaginal bleeding, nausea, vomiting, diarrhea, or painful urination. This information is not intended to replace advice given to you by your health care provider. Make sure you discuss any questions you have with your health care provider. Document Revised: 04/14/2018 Document Reviewed: 04/14/2018 Elsevier Patient Education  [place] of Pregnancy  The second trimester is from week 14 through week 27 (month 4 through 6). This is often the time in pregnancy that you feel your best. Often times, morning sickness has lessened or quit. You may have more energy, and you may get hungry more often. Your unborn baby is growing rapidly. At the end of the sixth month, he or she is about 9 inches long and weighs about 1 pounds. You will likely feel the baby move between 18 and 20 weeks  of pregnancy. Follow these instructions at home: Medicines  Take over-the-counter and prescription medicines only as told by your doctor. Some medicines are safe and some medicines are not safe during pregnancy.  Take a prenatal vitamin that contains at least 600 micrograms (mcg) of folic acid.  If you have trouble pooping (constipation), take medicine that will make your stool soft (stool softener) if your doctor approves. Eating and drinking   Eat regular, healthy meals.  Avoid raw meat and uncooked cheese.  If you get low calcium from the food you eat, talk to your doctor about taking a daily calcium supplement.  Avoid foods that are high in fat and sugars, such as fried and sweet foods.  If you feel sick to your stomach (nauseous) or throw up (vomit): ? Eat 4 or 5 small meals a day instead of 3 large meals. ? Try eating a few soda crackers. ? Drink liquids between meals instead of during meals.  To prevent constipation: ? Eat foods that are high in fiber, like fresh fruits and vegetables, whole grains, and beans. ? Drink enough fluids to keep your pee (urine) clear or pale yellow. Activity  Exercise only as told by your doctor. Stop exercising if you start to have cramps.  Do not exercise if it is too hot, too humid, or if you are in a place of great height (high altitude).  Avoid heavy lifting.  Wear low-heeled shoes. Sit and stand up straight.  You can continue to have sex unless your doctor tells you not to. Relieving pain and discomfort  Wear a good support bra if your breasts are tender.  Take warm water baths (sitz baths) to soothe pain or discomfort caused by hemorrhoids. Use hemorrhoid cream if your doctor approves.  Rest with your legs raised if you have leg cramps or low back pain.  If you develop puffy, bulging veins (varicose veins) in your legs: ? Wear support hose or compression stockings as told by your doctor. ? Raise (elevate) your feet for 15  minutes, 3-4 times a day. ? Limit salt in your food. Prenatal care  Write down your questions. Take them to your prenatal visits.  Keep all your prenatal visits as told by your doctor. This is important. Safety  Wear your seat belt  when driving.  Make a list of emergency phone numbers, including numbers for family, friends, the hospital, and police and fire departments. General instructions  Ask your doctor about the right foods to eat or for help finding a counselor, if you need these services.  Ask your doctor about local prenatal classes. Begin classes before month 6 of your pregnancy.  Do not use hot tubs, steam rooms, or saunas.  Do not douche or use tampons or scented sanitary pads.  Do not cross your legs for long periods of time.  Visit your dentist if you have not done so. Use a soft toothbrush to brush your teeth. Floss gently.  Avoid all smoking, herbs, and alcohol. Avoid drugs that are not approved by your doctor.  Do not use any products that contain nicotine or tobacco, such as cigarettes and e-cigarettes. If you need help quitting, ask your doctor.  Avoid cat litter boxes and soil used by cats. These carry germs that can cause birth defects in the baby and can cause a loss of your baby (miscarriage) or stillbirth. Contact a doctor if:  You have mild cramps or pressure in your lower belly.  You have pain when you pee (urinate).  You have bad smelling fluid coming from your vagina.  You continue to feel sick to your stomach (nauseous), throw up (vomit), or have watery poop (diarrhea).  You have a nagging pain in your belly area.  You feel dizzy. Get help right away if:  You have a fever.  You are leaking fluid from your vagina.  You have spotting or bleeding from your vagina.  You have severe belly cramping or pain.  You lose or gain weight rapidly.  You have trouble catching your breath and have chest pain.  You notice sudden or extreme  puffiness (swelling) of your face, hands, ankles, feet, or legs.  You have not felt the baby move in over an hour.  You have severe headaches that do not go away when you take medicine.  You have trouble seeing. Summary  The second trimester is from week 14 through week 27 (months 4 through 6). This is often the time in pregnancy that you feel your best.  To take care of yourself and your unborn baby, you will need to eat healthy meals, take medicines only if your doctor tells you to do so, and do activities that are safe for you and your baby.  Call your doctor if you get sick or if you notice anything unusual about your pregnancy. Also, call your doctor if you need help with the right food to eat, or if you want to know what activities are safe for you. This information is not intended to replace advice given to you by your health care provider. Make sure you discuss any questions you have with your health care provider. Document Revised: 02/18/2019 Document Reviewed: 12/02/2016 Elsevier Patient Education  [place].   Common Medications Safe in Pregnancy  Acne:      Constipation:  Benzoyl Peroxide     Colace  Clindamycin      Dulcolax Suppository  Topica Erythromycin     Fibercon  Salicylic Acid      Metamucil         Miralax AVOID:        Senakot   Accutane    Cough:  Retin-A       Cough Drops  Tetracycline      Phenergan w/ Codeine if Rx  Minocycline      Robitussin (Plain & DM)  Antibiotics:     Crabs/Lice:  Ceclor       RID  Cephalosporins    AVOID:  E-Mycins      Kwell  Keflex  Macrobid/Macrodantin   Diarrhea:  Penicillin      Kao-Pectate  Zithromax      Imodium AD         PUSH FLUIDS AVOID:       Cipro     Fever:  Tetracycline      Tylenol (Regular or Extra  Minocycline       Strength)  Levaquin      Extra Strength-Do not          Exceed 8 tabs/24 hrs Caffeine:        '200mg'$ /day (equiv. To 1 cup of coffee or  approx. 3 12 oz  sodas)         Gas: Cold/Hayfever:       Gas-X  Benadryl      Mylicon  Claritin       Phazyme  **Claritin-D        Chlor-Trimeton    Headaches:  Dimetapp      ASA-Free Excedrin  Drixoral-Non-Drowsy     Cold Compress  Mucinex (Guaifenasin)     Tylenol (Regular or Extra  Sudafed/Sudafed-12 Hour     Strength)  **Sudafed PE Pseudoephedrine   Tylenol Cold & Sinus     Vicks Vapor Rub  Zyrtec  **AVOID if Problems With Blood Pressure         Heartburn: Avoid lying down for at least 1 hour after meals  Aciphex      Maalox     Rash:  Milk of Magnesia     Benadryl    Mylanta       1% Hydrocortisone Cream  Pepcid  Pepcid Complete   Sleep Aids:  Prevacid      Ambien   Prilosec       Benadryl  Rolaids       Chamomile Tea  Tums (Limit 4/day)     Unisom         Tylenol PM         Warm milk-add vanilla or  Hemorrhoids:       Sugar for taste  Anusol/Anusol H.C.  (RX: Analapram 2.5%)  Sugar Substitutes:  Hydrocortisone OTC     Ok in moderation  Preparation H      Tucks        Vaseline lotion applied to tissue with wiping    Herpes:     Throat:  Acyclovir      Oragel  Famvir  Valtrex     Vaccines:         Flu Shot Leg Cramps:       *Gardasil  Benadryl      Hepatitis A         Hepatitis B Nasal Spray:       Pneumovax  Saline Nasal Spray     Polio Booster         Tetanus Nausea:       Tuberculosis test or PPD  Vitamin B6 25 mg TID   AVOID:    Dramamine      *Gardasil  Emetrol       Live Poliovirus  Ginger Root 250 mg QID    MMR (measles, mumps &  High Complex Carbs @ Bedtime    rebella)  Sea Bands-Accupressure  Varicella (Chickenpox)  Unisom 1/2 tab TID     *No known complications           If received before Pain:         Known pregnancy;   Darvocet       Resume series after  Lortab        Delivery  Percocet    Yeast:   Tramadol      Femstat  Tylenol 3      Gyne-lotrimin  Ultram       Monistat  Vicodin           MISC:         All Sunscreens           Hair  Coloring/highlights          Insect Repellant's          (Including DEET)         Mystic Tans   Skin Conditions During Pregnancy Pregnancy affects many parts of the human body. One part is the skin. Most skin problems that develop during pregnancy are not serious and are considered a normal part of pregnancy. Many skin problems go away on their own after the baby is born. What type of skin problems can develop during pregnancy? Common skin conditions  Stretch marks. Stretch marks are purple or pink lines on the skin. They may appear on the belly, breasts, thighs, or buttocks. Stretch marks are caused by weight gain, which causes the skin to stretch. Stretch marks do not cause problems. Almost all women get them during pregnancy. Most stretch marks fade after pregnancy, but may not disappear completely.  Darkening of the skin (hyperpigmentation). The darkening may occur in patches or as a line. Patches may appear on the face (melasma), nipples, or genital area. A dark line may also form and stretch from the belly button to the pubic area (linea nigra). Hyperpigmentation develops in almost all pregnant women. It is more severe in women with a dark complexion.  Spider angiomas or spider veins. These are tiny pink or red lines that go out from a center point, like the legs of a spider. Usually, they are on the face, neck, and arms. They do not cause problems. They are most common in women with light complexions. Spider veins usually fade after the baby is born.  Varicose veins. Excess blood volume during pregnancy can cause veins to enlarge. They can look like swollen veins above the surface of the skin and are usually red or purple. They are usually found on the legs and buttocks (hemorrhoids). In most cases, the varicose veins go away after pregnancy.  Pruritic urticarial papules and plaques of pregnancy (PUPPP rash). This is an itchy, red rash that has tiny blisters. The cause is unknown. It  generally starts on the abdomen and may affect the arms or legs. It usually begins later in pregnancy. The rash is not known to affect the fetus. Sometimes, oral steroids are used to soothe the itch. The rash clears after the baby is born. Uncommon skin conditions  Pemphigoid gestationis. This is a very rare autoimmune disease. It causes a severely itchy rash and blisters. The rash usually appears on the abdomen, buttocks, arms, and legs. It usually goes away within 3 months after delivery. It may return (recur) with subsequent pregnancies.  Pruritic folliculitis of pregnancy. This is a rare condition that causes pimple-like skin growths. It develops in the middle or later stages of pregnancy. The cause of  this condition is not known. It usually goes away 2-3 weeks after delivery, but can recur with subsequent pregnancies.  Intrahepatic cholestasis of pregnancy. This is a rare liver condition that causes itchy skin, but not a rash. It usually occurs on the palms of the hands and soles of the feet, but may spread to the abdomen. It usually starts in the third trimester and can increase the risk of complications for the fetus. This condition usually heals after delivery, but it can recur in future pregnancies. It may run in families (inherited).  Impetigo herpetiformis. This is a form of a severe skin disease, also called pustular psoriasis. It causes many translucent, white bumps that may form a crust when they burst. It usually occurs in the third trimester. The condition usually heals after delivery, but can recur in future pregnancies.  Palmar erythema. This is a reddening of the palms. It is most common in women with light complexions. It usually fades away after pregnancy.  Prurigo of pregnancy. This is a disease in which itchy red patches and bumps appear on the body. This condition can happen any time during pregnancy. Usually, it starts as a few bumps but increases each day. The cause is unknown.  The patches and bumps clear after the baby is born. Other skin conditions  Swelling and redness. This can occur on the face, eyelids, fingers, or toes.  Acne. Pimples may develop, including in women who have had clear skin for a long time.  Skin tags. These are small flaps of skin that stick out from the body. They may grow or become darker during pregnancy. They are usually harmless. They do not go away on their own, but can be removed by a health care provider.  Moles. These are flat or slightly raised growths. They are usually round and pink or brown. They may grow or become darker during pregnancy. Some skin problems that were there before pregnancy (pre-existing skin conditions), such as atopic dermatitis or psoriasis, may become worse during pregnancy. Follow these instructions at home: Different conditions may have different instructions for care. In general:  Follow all your health care provider's directions about medicines to treat skin problems while you are pregnant. Do not use over-the-counter medicines, creams, or lotions until you have checked with your health care provider. Many medicines are not safe to use when you are pregnant.  Limit time in the sun. This will help keep your skin from darkening. When you must be outside, use sunscreen and wear a hat with a wide brim to protect your face. The sunscreen should have a SPF of at least 33.  Use a gentle soap. This helps prevent skin irritation.  Do not get too hot or too sweaty. This makes some skin rashes worse.  Wear loose clothes made of a soft fabric. This prevents skin irritation.  Use a skin moisturizer. Ask your health care provider for suggestions.  Exercise regularly, if possible. Ask your health care provider about the activities that are safe for you. Healthy pregnant women should aim for 2 hours and 30 minutes of moderate exercise per week. This can help reduce several pregnancy side effects, including varicose  veins.  Keep all follow-up visits as told by your health care provider. This is important. Summary  Most skin problems that develop during pregnancy are not serious and are considered a normal part of pregnancy. They usually go away on their own after the baby is born.  Some common skin problems include stretch  marks, darkening of the skin, spider veins, varicose veins, and PUPPP rash.  Follow all your health care provider's directions about medicines to treat skin problems while you are pregnant. Do not use any over-the-counter medicines, creams, or lotions until you have checked with your health care provider.  To prevent and manage skin problems, use gentle soap, try not to get too hot or sweaty, wear comfortable, loose-fitting clothing, and limit time in the sun.  Some pre-existing skin conditions, such as atopic dermatitis or psoriasis, may become worse during pregnancy. This information is not intended to replace advice given to you by your health care provider. Make sure you discuss any questions you have with your health care provider. Document Revised: 02/17/2019 Document Reviewed: 02/03/2017 Elsevier Patient Education  [place].

## 2020-10-01 ENCOUNTER — Encounter: Payer: 59 | Admitting: Certified Nurse Midwife

## 2020-10-02 ENCOUNTER — Ambulatory Visit (INDEPENDENT_AMBULATORY_CARE_PROVIDER_SITE_OTHER): Payer: 59 | Admitting: Certified Nurse Midwife

## 2020-10-02 ENCOUNTER — Other Ambulatory Visit: Payer: Self-pay

## 2020-10-02 ENCOUNTER — Encounter: Payer: Self-pay | Admitting: Certified Nurse Midwife

## 2020-10-02 VITALS — BP 123/77 | HR 81 | Wt 233.3 lb

## 2020-10-02 DIAGNOSIS — Z3A25 25 weeks gestation of pregnancy: Secondary | ICD-10-CM

## 2020-10-02 DIAGNOSIS — Z3482 Encounter for supervision of other normal pregnancy, second trimester: Secondary | ICD-10-CM

## 2020-10-02 LAB — POCT URINALYSIS DIPSTICK OB
Bilirubin, UA: NEGATIVE
Blood, UA: NEGATIVE
Glucose, UA: NEGATIVE
Ketones, UA: NEGATIVE
Leukocytes, UA: NEGATIVE
Nitrite, UA: NEGATIVE
POC,PROTEIN,UA: NEGATIVE
Spec Grav, UA: <=1.005 — AB (ref 1.010–1.025)
Urobilinogen, UA: 0.2 E.U./dL
pH, UA: 6 (ref 5.0–8.0)

## 2020-10-02 NOTE — Patient Instructions (Signed)
Glucose Tolerance Test During Pregnancy Why am I having this test? The glucose tolerance test (GTT) is done to check how your body processes sugar (glucose). This is one of several tests used to diagnose diabetes that develops during pregnancy (gestational diabetes mellitus). Gestational diabetes is a temporary form of diabetes that some women develop during pregnancy. It usually occurs during the second trimester of pregnancy and goes away after delivery. Testing (screening) for gestational diabetes usually occurs between 24 and 28 weeks of pregnancy. You may have the GTT test after having a 1-hour glucose screening test if the results from that test indicate that you may have gestational diabetes. You may also have this test if:  You have a history of gestational diabetes.  You have a history of giving birth to very large babies or have experienced repeated fetal loss (stillbirth).  You have signs and symptoms of diabetes, such as: ? Changes in your vision. ? Tingling or numbness in your hands or feet. ? Changes in hunger, thirst, and urination that are not otherwise explained by your pregnancy. What is being tested? This test measures the amount of glucose in your blood at different times during a period of 3 hours. This indicates how well your body is able to process glucose. What kind of sample is taken?  Blood samples are required for this test. They are usually collected by inserting a needle into a blood vessel. How do I prepare for this test?  For 3 days before your test, eat normally. Have plenty of carbohydrate-rich foods.  Follow instructions from your health care provider about: ? Eating or drinking restrictions on the day of the test. You may be asked to not eat or drink anything other than water (fast) starting 8-10 hours before the test. ? Changing or stopping your regular medicines. Some medicines may interfere with this test. Tell a health care provider about:  All  medicines you are taking, including vitamins, herbs, eye drops, creams, and over-the-counter medicines.  Any blood disorders you have.  Any surgeries you have had.  Any medical conditions you have. What happens during the test? First, your blood glucose will be measured. This is referred to as your fasting blood glucose, since you fasted before the test. Then, you will drink a glucose solution that contains a certain amount of glucose. Your blood glucose will be measured again 1, 2, and 3 hours after drinking the solution. This test takes about 3 hours to complete. You will need to stay at the testing location during this time. During the testing period:  Do not eat or drink anything other than the glucose solution.  Do not exercise.  Do not use any products that contain nicotine or tobacco, such as cigarettes and e-cigarettes. If you need help stopping, ask your health care provider. The testing procedure may vary among health care providers and hospitals. How are the results reported? Your results will be reported as milligrams of glucose per deciliter of blood (mg/dL) or millimoles per liter (mmol/L). Your health care provider will compare your results to normal ranges that were established after testing a large group of people (reference ranges). Reference ranges may vary among labs and hospitals. For this test, common reference ranges are:  Fasting: less than 95-105 mg/dL (5.3-5.8 mmol/L).  1 hour after drinking glucose: less than 180-190 mg/dL (10.0-10.5 mmol/L).  2 hours after drinking glucose: less than 155-165 mg/dL (8.6-9.2 mmol/L).  3 hours after drinking glucose: 140-145 mg/dL (7.8-8.1 mmol/L). What do the   results mean? Results within reference ranges are considered normal, meaning that your glucose levels are well-controlled. If two or more of your blood glucose levels are high, you may be diagnosed with gestational diabetes. If only one level is high, your health care  provider may suggest repeat testing or other tests to confirm a diagnosis. Talk with your health care provider about what your results mean. Questions to ask your health care provider Ask your health care provider, or the department that is doing the test:  When will my results be ready?  How will I get my results?  What are my treatment options?  What other tests do I need?  What are my next steps? Summary  The glucose tolerance test (GTT) is one of several tests used to diagnose diabetes that develops during pregnancy (gestational diabetes mellitus). Gestational diabetes is a temporary form of diabetes that some women develop during pregnancy.  You may have the GTT test after having a 1-hour glucose screening test if the results from that test indicate that you may have gestational diabetes. You may also have this test if you have any symptoms or risk factors for gestational diabetes.  Talk with your health care provider about what your results mean. This information is not intended to replace advice given to you by your health care provider. Make sure you discuss any questions you have with your health care provider. Document Revised: 02/17/2019 Document Reviewed: 06/08/2017 Elsevier Patient Education  2020 [employer]  

## 2020-10-02 NOTE — Progress Notes (Signed)
ROB doing well. Feels good movement. Discussed glucose screen next visit. She verbalizes and agrees to plan. Pt request to get glucose drink to allow her to put in the refrigerator the night before . She state she does better when the drink is cold. Agreed that this would be ok as long as she does not open the drink . She will need to open it in front of use . She agrees. Follow up in 3 wks with Marcelino Duster.   Doreene Burke, CNM

## 2020-10-15 ENCOUNTER — Ambulatory Visit: Payer: 59 | Admitting: Dermatology

## 2020-10-22 ENCOUNTER — Encounter: Payer: Self-pay | Admitting: Certified Nurse Midwife

## 2020-10-22 ENCOUNTER — Other Ambulatory Visit: Payer: 59

## 2020-10-22 ENCOUNTER — Ambulatory Visit (INDEPENDENT_AMBULATORY_CARE_PROVIDER_SITE_OTHER): Payer: 59 | Admitting: Certified Nurse Midwife

## 2020-10-22 ENCOUNTER — Other Ambulatory Visit: Payer: Self-pay

## 2020-10-22 VITALS — BP 107/68 | HR 88 | Wt 234.7 lb

## 2020-10-22 DIAGNOSIS — Z3A28 28 weeks gestation of pregnancy: Secondary | ICD-10-CM | POA: Diagnosis not present

## 2020-10-22 DIAGNOSIS — Z23 Encounter for immunization: Secondary | ICD-10-CM

## 2020-10-22 DIAGNOSIS — Z131 Encounter for screening for diabetes mellitus: Secondary | ICD-10-CM

## 2020-10-22 DIAGNOSIS — Z130 Encounter for screening for diseases of the blood and blood-forming organs and certain disorders involving the immune mechanism: Secondary | ICD-10-CM

## 2020-10-22 DIAGNOSIS — Z6721 Type B blood, Rh negative: Secondary | ICD-10-CM

## 2020-10-22 DIAGNOSIS — Z3483 Encounter for supervision of other normal pregnancy, third trimester: Secondary | ICD-10-CM

## 2020-10-22 LAB — POCT URINALYSIS DIPSTICK OB
Blood, UA: NEGATIVE
Glucose, UA: NEGATIVE
Ketones, UA: NEGATIVE
Leukocytes, UA: NEGATIVE
Nitrite, UA: NEGATIVE
Spec Grav, UA: 1.025 (ref 1.010–1.025)
Urobilinogen, UA: 0.2 E.U./dL
pH, UA: 6 (ref 5.0–8.0)

## 2020-10-22 MED ORDER — RHO D IMMUNE GLOBULIN 1500 UNIT/2ML IJ SOSY
300.0000 ug | PREFILLED_SYRINGE | Freq: Once | INTRAMUSCULAR | Status: AC
  Administered 2020-10-22: 300 ug via INTRAMUSCULAR

## 2020-10-22 NOTE — Progress Notes (Signed)
ROB - Doing well, no questions or concerns at this time. Third trimester handouts provided. Tdap and rhogam given, see chart. 28 week labs today, see orders. Breastfeeding education completed, see chart. Blod transfusion consent signed. Anticipatory guidance regarding course of prenatal care. Reviewed red flag symptoms and when to call. RTC in 2 weeks with Pattricia Boss or sooner if needed.  Juliann Pares, Student-MidWife [employer] 10/22/20 10:49 AM

## 2020-10-22 NOTE — Progress Notes (Signed)
aaau auPt present for routine prenatal visit. Administered tdap vaccine. Pt tolerated well. No adverse reactions. Administered Rhogam. Pt tolerated well. No adverse reactions.

## 2020-10-22 NOTE — Progress Notes (Signed)
I have seen, interviewed, and examined the patient in conjunction with the [employer] midwife and affirm the diagnosis and management plan.   Gunnar Bulla, CNM [facility], [facility] 10/22/20 12:12 PM

## 2020-10-22 NOTE — Patient Instructions (Signed)
Pain Relief During Labor and Delivery Many things can cause pain during labor and delivery, including:  Pressure on bones and ligaments due to the baby moving through the pelvis.  Stretching of tissues due to the baby moving through the birth canal.  Muscle tension due to anxiety or nervousness.  The uterus tightening (contracting) and relaxing to help move the baby. There are many ways to deal with the pain of labor and delivery. They include:  Taking prenatal classes. Taking these classes helps you know what to expect during your baby's birth. What you learn will increase your confidence and decrease your anxiety.  Practicing relaxation techniques or doing relaxing activities, such as: ? Focused breathing. ? Meditation. ? Visualization. ? Aroma therapy. ? Listening to your favorite music. ? Hypnosis.  Taking a warm shower or bath (hydrotherapy). This may: ? Provide comfort and relaxation. ? Lessen your perception of pain. ? Decrease the amount of pain medicine needed. ? Decrease the length of labor.  Getting a massage or counterpressure on your back.  Applying warm packs or ice packs.  Changing positions often, moving around, or using a birthing ball.  Getting: ? Pain medicine through an IV or injection into a muscle. ? Pain medicine inserted into your spinal column. ? Injections of sterile water just under the skin on your lower back (intradermal injections). ? Laughing gas (nitrous oxide). Discuss your pain control options with your health care provider during your prenatal visits. Explore the options offered by your hospital or birth center. What kinds of medicine are available? There are two kinds of medicines that can be used to relieve pain during labor and delivery:  Analgesics. These medicines decrease pain without causing you to lose feeling or the ability to move your muscles.  Anesthetics. These medicines block feeling in the body and can decrease  your ability to move freely. Both of these kinds of medicine can cause minor side effects, such as nausea, trouble concentrating, and sleepiness. They can also decrease the baby's heart rate before birth and affect the baby's breathing rate after birth. For this reason, health care providers are careful about when and how much medicine is given. What are specific medicines and procedures that provide pain relief? Local Anesthetics Local anesthetics are used to numb a small area of the body. They may be used along with another kind of anesthetic or used to numb the nerves of the vagina, cervix, and perineum during the second stage of labor. General Anesthetics General anesthetics cause you to lose consciousness so you do not feel pain. They are usually only used for an emergency cesarean delivery. General anesthetics are given through an IV tube and a mask. Pudendal Block A pudendal block is a form of local anesthetic. It may be used to relieve the pain associated with pushing or stretching of the perineum at the time of delivery or to further numb the perineum. A pudendal block is done by injecting numbing medicine through the vaginal wall into a nerve in the pelvis. Epidural Analgesia Epidural analgesia is given through a flexible IV catheter that is inserted into the lower back. Numbing medicine is delivered continuously to the area near your spinal column nerves (epidural space). After having this type of analgesia, you may be able to move your legs but you most likely will not be able to walk. Depending on the amount of medicine given, you may lose all feeling in the lower half of your body, or you  may retain some level of sensation, including the urge to push. Epidural analgesia can be used to provide pain relief for a vaginal birth. Spinal Block A spinal block is similar to epidural analgesia, but the medicine is injected into the spinal fluid instead of the epidural space. A spinal block is only  given once. It starts to relieve pain quickly, but the pain relief lasts only 1-6 hours. Spinal blocks can be used for cesarean deliveries. Combined Spinal-Epidural (CSE) Block A CSE block combines the effects of a spinal block and epidural analgesia. The spinal block works quickly to block all pain. The epidural analgesia provides continuous pain relief, even after the effects of the spinal block have worn off. This information is not intended to replace advice given to you by your health care provider. Make sure you discuss any questions you have with your health care provider. Document Revised: 10/09/2017 Document Reviewed: 03/19/2016 Elsevier Patient Education  2020 [employer].  Vaginal Birth After Cesarean Delivery  Vaginal birth after cesarean delivery (VBAC) is giving birth vaginally after previously delivering a baby through a cesarean section (C-section). A VBAC may be a safe option for you, depending on your health and other factors. It is important to discuss VBAC with your health care provider early in your pregnancy so you can understand the risks, benefits, and options. Having these discussions early will give you time to make your birth plan. Who are the best candidates for VBAC? The best candidates for VBAC are women who:  Have had one or two prior cesarean deliveries, and the incision made during the delivery was horizontal (low transverse).  Do not have a vertical (classical) scar on their uterus.  Have not had a tear in the wall of their uterus (uterine rupture).  Plan to have more pregnancies. A VBAC is also more likely to be successful:  In women who have previously given birth vaginally.  When labor starts by itself (spontaneously) before the due date. What are the benefits of VBAC? The benefits of delivering your baby vaginally instead of by a cesarean delivery include:  A shorter hospital stay.  A faster recovery time.  Less pain.  Avoiding risks  associated with major surgery, such as infection and blood clots.  Less blood loss and less need for donated blood (transfusions). What are the risks of VBAC? The main risk of attempting a VBAC is that it may fail, forcing your health care provider to deliver your baby by a C-section. Other risks are rare and include:  Tearing (rupture) of the scar from a past cesarean delivery.  Other risks associated with vaginal deliveries. If a repeat cesarean delivery is needed, the risks include:  Blood loss.  Infection.  Blood clot.  Damage to surrounding organs.  Removal of the uterus (hysterectomy), if it is damaged.  Placenta problems in future pregnancies. What else should I know about my options? Delivering a baby through a VBAC is similar to having a normal spontaneous vaginal delivery. Therefore, it is safe:  To try with twins.  For your health care provider to try to turn the baby from a breech position (external cephalic version) during labor.  With epidural analgesia for pain relief. Consider where you would like to deliver your baby. VBAC should be attempted in facilities where an emergency cesarean delivery can be performed. VBAC is not recommended for home births. Any changes in your health or your baby's health during your pregnancy may make it necessary to change your initial  decision about VBAC. Your health care provider may recommend that you do not attempt a VBAC if:  Your baby's suspected weight is 8.8 lb (4 kg) or more.  You have preeclampsia. This is a condition that causes high blood pressure along with other symptoms, such as swelling and headaches.  You will have VBAC less than 19 months after your cesarean delivery.  You are past your due date.  You need to have labor started (induced) because your cervix is not ready for labor (unfavorable). Where to find more information  American Pregnancy Association: americanpregnancy.org  [employer] of  Obstetricians and Gynecologists: acog.org Summary  Vaginal birth after cesarean delivery (VBAC) is giving birth vaginally after previously delivering a baby through a cesarean section (C-section). A VBAC may be a safe option for you, depending on your health and other factors.  Discuss VBAC with your health care provider early in your pregnancy so you can understand the risks, benefits, options, and have plenty of time to make your birth plan.  The main risk of attempting a VBAC is that it may fail, forcing your health care provider to deliver your baby by a C-section. Other risks are rare. This information is not intended to replace advice given to you by your health care provider. Make sure you discuss any questions you have with your health care provider. Document Revised: 02/22/2019 Document Reviewed: 02/03/2017 Elsevier Patient Education  2020 [employer].   Preparing for Vaginal Birth After Cesarean Delivery Vaginal birth after cesarean delivery (VBAC) is giving birth vaginally after previously delivering a baby through a cesarean section (C-section). You and your health are provider will discuss your options and whether you may be a good candidate for VBAC. What are my options? After a cesarean delivery, your options for future deliveries may include:  Scheduled repeat cesarean delivery. This is done in a hospital with an operating room.  Trial of labor after cesarean (TOLAC). A successful TOLAC results in a vaginal delivery. If it is not successful, you will need to have a cesarean delivery. TOLAC should be attempted in facilities where an emergency cesarean delivery can be performed. It should not be done as a home birth. Talk with your health care provider about the risks and benefits of each option early in your pregnancy. The best option for you will depend on your preferences and your overall health as well as your baby's. What should I know about my past cesarean delivery? It  is important to know what type of incision was made in your uterus in a past cesarean delivery. The type of incision can affect the success of your TOLAC. Types of incisions include:  Low transverse. This is a side-to-side cut low on your uterus. The scar on your skin looks like a horizontal line just above your pubic area. This type of cut is the most common and makes you a good candidate for TOLAC.  Low vertical. This is an up-and-down cut low on your uterus. The scar on your skin looks like a vertical line between your pubic area and belly button. This type of cut puts you at higher risk for problems during TOLAC.  High vertical or classical. This is an up-and-down cut high on your uterus. The scar on your skin looks like a vertical line that runs over the top of your belly button. This type of cut has the highest risk for problems and usually means that TOLAC is not an option. When is VBAC not an option?  As you progress through your pregnancy, circumstances may change and you may need to reconsider your options. Your situation may also change even as you begin TOLAC. Your health care provider may not want you to attempt a VBAC if you:  Need to have labor started (induced) because your cervix is not ready for labor.  Have never had a vaginal delivery.  Have had more than two cesarean deliveries.  Are overdue.  Are pregnant with a very large baby.  Have a condition that causes high blood pressure (preeclampsia). Questions to ask your health care provider  Am I a good candidate for TOLAC?  What are my chances of a successful vaginal delivery?  Is my preferred birth location equipped for a TOLAC?  What are my pain management options during a TOLAC? Where to find more information  American Congress of Obstetricians and Gynecologists: www.acog.org  [employer] of Nurse-Midwives: www.midwife.org Summary  Vaginal birth after cesarean delivery (VBAC) is giving birth vaginally  after previously delivering a baby through a cesarean section (C-section).  VBAC may be a safe and appropriate option for you depending on your medical history and other risk factors. Talk with your health care provider about the options available to you, and the risks and benefits of each early in your pregnancy.  TOLAC should be attempted in facilities where emergency cesarean section procedures can be performed. This information is not intended to replace advice given to you by your health care provider. Make sure you discuss any questions you have with your health care provider. Document Revised: 02/22/2019 Document Reviewed: 02/05/2017 Elsevier Patient Education  2020 [employer].  Breastfeeding  Choosing to breastfeed is one of the best decisions you can make for yourself and your baby. A change in hormones during pregnancy causes your breasts to make breast milk in your milk-producing glands. Hormones prevent breast milk from being released before your baby is born. They also prompt milk flow after birth. Once breastfeeding has begun, thoughts of your baby, as well as his or her sucking or crying, can stimulate the release of milk from your milk-producing glands. Benefits of breastfeeding Research shows that breastfeeding offers many health benefits for infants and mothers. It also offers a cost-free and convenient way to feed your baby. For your baby  Your first milk (colostrum) helps your baby's digestive system to function better.  Special cells in your milk (antibodies) help your baby to fight off infections.  Breastfed babies are less likely to develop asthma, allergies, obesity, or type 2 diabetes. They are also at lower risk for sudden infant death syndrome (SIDS).  Nutrients in breast milk are better able to meet your baby's needs compared to infant formula.  Breast milk improves your baby's brain development. For you  Breastfeeding helps to create a very special bond  between you and your baby.  Breastfeeding is convenient. Breast milk costs nothing and is always available at the correct temperature.  Breastfeeding helps to burn calories. It helps you to lose the weight that you gained during pregnancy.  Breastfeeding makes your uterus return faster to its size before pregnancy. It also slows bleeding (lochia) after you give birth.  Breastfeeding helps to lower your risk of developing type 2 diabetes, osteoporosis, rheumatoid arthritis, cardiovascular disease, and breast, ovarian, uterine, and endometrial cancer later in life. Breastfeeding basics Starting breastfeeding  Find a comfortable place to sit or lie down, with your neck and back well-supported.  Place a pillow or a rolled-up blanket under your  baby to bring him or her to the level of your breast (if you are seated). Nursing pillows are specially designed to help support your arms and your baby while you breastfeed.  Make sure that your baby's tummy (abdomen) is facing your abdomen.  Gently massage your breast. With your fingertips, massage from the outer edges of your breast inward toward the nipple. This encourages milk flow. If your milk flows slowly, you may need to continue this action during the feeding.  Support your breast with 4 fingers underneath and your thumb above your nipple (make the letter "C" with your hand). Make sure your fingers are well away from your nipple and your baby's mouth.  Stroke your baby's lips gently with your finger or nipple.  When your baby's mouth is open wide enough, quickly bring your baby to your breast, placing your entire nipple and as much of the areola as possible into your baby's mouth. The areola is the colored area around your nipple. ? More areola should be visible above your baby's upper lip than below the lower lip. ? Your baby's lips should be opened and extended outward (flanged) to ensure an adequate, comfortable latch. ? Your baby's tongue  should be between his or her lower gum and your breast.  Make sure that your baby's mouth is correctly positioned around your nipple (latched). Your baby's lips should create a seal on your breast and be turned out (everted).  It is common for your baby to suck about 2-3 minutes in order to start the flow of breast milk. Latching Teaching your baby how to latch onto your breast properly is very important. An improper latch can cause nipple pain, decreased milk supply, and poor weight gain in your baby. Also, if your baby is not latched onto your nipple properly, he or she may swallow some air during feeding. This can make your baby fussy. Burping your baby when you switch breasts during the feeding can help to get rid of the air. However, teaching your baby to latch on properly is still the best way to prevent fussiness from swallowing air while breastfeeding. Signs that your baby has successfully latched onto your nipple  Silent tugging or silent sucking, without causing you pain. Infant's lips should be extended outward (flanged).  Swallowing heard between every 3-4 sucks once your milk has started to flow (after your let-down milk reflex occurs).  Muscle movement above and in front of his or her ears while sucking. Signs that your baby has not successfully latched onto your nipple  Sucking sounds or smacking sounds from your baby while breastfeeding.  Nipple pain. If you think your baby has not latched on correctly, slip your finger into the corner of your baby's mouth to break the suction and place it between your baby's gums. Attempt to start breastfeeding again. Signs of successful breastfeeding Signs from your baby  Your baby will gradually decrease the number of sucks or will completely stop sucking.  Your baby will fall asleep.  Your baby's body will relax.  Your baby will retain a small amount of milk in his or her mouth.  Your baby will let go of your breast by himself or  herself. Signs from you  Breasts that have increased in firmness, weight, and size 1-3 hours after feeding.  Breasts that are softer immediately after breastfeeding.  Increased milk volume, as well as a change in milk consistency and color by the fifth day of breastfeeding.  Nipples that are  not sore, cracked, or bleeding. Signs that your baby is getting enough milk  Wetting at least 1-2 diapers during the first 24 hours after birth.  Wetting at least 5-6 diapers every 24 hours for the first week after birth. The urine should be clear or pale yellow by the age of 5 days.  Wetting 6-8 diapers every 24 hours as your baby continues to grow and develop.  At least 3 stools in a 24-hour period by the age of 5 days. The stool should be soft and yellow.  At least 3 stools in a 24-hour period by the age of 7 days. The stool should be seedy and yellow.  No loss of weight greater than 10% of birth weight during the first 3 days of life.  Average weight gain of 4-7 oz (113-198 g) per week after the age of 4 days.  Consistent daily weight gain by the age of 5 days, without weight loss after the age of 2 weeks. After a feeding, your baby may spit up a small amount of milk. This is normal. Breastfeeding frequency and duration Frequent feeding will help you make more milk and can prevent sore nipples and extremely full breasts (breast engorgement). Breastfeed when you feel the need to reduce the fullness of your breasts or when your baby shows signs of hunger. This is called "breastfeeding on demand." Signs that your baby is hungry include:  Increased alertness, activity, or restlessness.  Movement of the head from side to side.  Opening of the mouth when the corner of the mouth or cheek is stroked (rooting).  Increased sucking sounds, smacking lips, cooing, sighing, or squeaking.  Hand-to-mouth movements and sucking on fingers or hands.  Fussing or crying. Avoid introducing a pacifier to  your baby in the first 4-6 weeks after your baby is born. After this time, you may choose to use a pacifier. Research has shown that pacifier use during the first year of a baby's life decreases the risk of sudden infant death syndrome (SIDS). Allow your baby to feed on each breast as long as he or she wants. When your baby unlatches or falls asleep while feeding from the first breast, offer the second breast. Because newborns are often sleepy in the first few weeks of life, you may need to awaken your baby to get him or her to feed. Breastfeeding times will vary from baby to baby. However, the following rules can serve as a guide to help you make sure that your baby is properly fed:  Newborns (babies 22 weeks of age or younger) may breastfeed every 1-3 hours.  Newborns should not go without breastfeeding for longer than 3 hours during the day or 5 hours during the night.  You should breastfeed your baby a minimum of 8 times in a 24-hour period. Breast milk pumping     Pumping and storing breast milk allows you to make sure that your baby is exclusively fed your breast milk, even at times when you are unable to breastfeed. This is especially important if you go back to work while you are still breastfeeding, or if you are not able to be present during feedings. Your lactation consultant can help you find a method of pumping that works best for you and give you guidelines about how long it is safe to store breast milk. Caring for your breasts while you breastfeed Nipples can become dry, cracked, and sore while breastfeeding. The following recommendations can help keep your breasts moisturized and  healthy:  Avoid using soap on your nipples.  Wear a supportive bra designed especially for nursing. Avoid wearing underwire-style bras or extremely tight bras (sports bras).  Air-dry your nipples for 3-4 minutes after each feeding.  Use only cotton bra pads to absorb leaked breast milk. Leaking of  breast milk between feedings is normal.  Use lanolin on your nipples after breastfeeding. Lanolin helps to maintain your skin's normal moisture barrier. Pure lanolin is not harmful (not toxic) to your baby. You may also hand express a few drops of breast milk and gently massage that milk into your nipples and allow the milk to air-dry. In the first few weeks after giving birth, some women experience breast engorgement. Engorgement can make your breasts feel heavy, warm, and tender to the touch. Engorgement peaks within 3-5 days after you give birth. The following recommendations can help to ease engorgement:  Completely empty your breasts while breastfeeding or pumping. You may want to start by applying warm, moist heat (in the shower or with warm, water-soaked hand towels) just before feeding or pumping. This increases circulation and helps the milk flow. If your baby does not completely empty your breasts while breastfeeding, pump any extra milk after he or she is finished.  Apply ice packs to your breasts immediately after breastfeeding or pumping, unless this is too uncomfortable for you. To do this: ? Put ice in a plastic bag. ? Place a towel between your skin and the bag. ? Leave the ice on for 20 minutes, 2-3 times a day.  Make sure that your baby is latched on and positioned properly while breastfeeding. If engorgement persists after 48 hours of following these recommendations, contact your health care provider or a Advertising copywriter. Overall health care recommendations while breastfeeding  Eat 3 healthy meals and 3 snacks every day. Well-nourished mothers who are breastfeeding need an additional 450-500 calories a day. You can meet this requirement by increasing the amount of a balanced diet that you eat.  Drink enough water to keep your urine pale yellow or clear.  Rest often, relax, and continue to take your prenatal vitamins to prevent fatigue, stress, and low vitamin and mineral  levels in your body (nutrient deficiencies).  Do not use any products that contain nicotine or tobacco, such as cigarettes and e-cigarettes. Your baby may be harmed by chemicals from cigarettes that pass into breast milk and exposure to secondhand smoke. If you need help quitting, ask your health care provider.  Avoid alcohol.  Do not use illegal drugs or marijuana.  Talk with your health care provider before taking any medicines. These include over-the-counter and prescription medicines as well as vitamins and herbal supplements. Some medicines that may be harmful to your baby can pass through breast milk.  It is possible to become pregnant while breastfeeding. If birth control is desired, ask your health care provider about options that will be safe while breastfeeding your baby. Where to find more information: [employer]: www.llli.org Contact a health care provider if:  You feel like you want to stop breastfeeding or have become frustrated with breastfeeding.  Your nipples are cracked or bleeding.  Your breasts are red, tender, or warm.  You have: ? Painful breasts or nipples. ? A swollen area on either breast. ? A fever or chills. ? Nausea or vomiting. ? Drainage other than breast milk from your nipples.  Your breasts do not become full before feedings by the fifth day after you give birth.  You feel sad and depressed.  Your baby is: ? Too sleepy to eat well. ? Having trouble sleeping. ? More than 641 week old and wetting fewer than 6 diapers in a 24-hour period. ? Not gaining weight by 415 days of age.  Your baby has fewer than 3 stools in a 24-hour period.  Your baby's skin or the white parts of his or her eyes become yellow. Get help right away if:  Your baby is overly tired (lethargic) and does not want to wake up and feed.  Your baby develops an unexplained fever. Summary  Breastfeeding offers many health benefits for infant and mothers.  Try  to breastfeed your infant when he or she shows early signs of hunger.  Gently tickle or stroke your baby's lips with your finger or nipple to allow the baby to open his or her mouth. Bring the baby to your breast. Make sure that much of the areola is in your baby's mouth. Offer one side and burp the baby before you offer the other side.  Talk with your health care provider or lactation consultant if you have questions or you face problems as you breastfeed. This information is not intended to replace advice given to you by your health care provider. Make sure you discuss any questions you have with your health care provider. Document Revised: 01/21/2018 Document Reviewed: 11/28/2016 Elsevier Patient Education  2020 [employer]  Third Trimester of Pregnancy  The third trimester is from week 28 through week 40 (months 7 through 9). This trimester is when your unborn baby (fetus) is growing very fast. At the end of the ninth month, the unborn baby is about 20 inches in length. It weighs about 6-10 pounds. Follow these instructions at home: Medicines  Take over-the-counter and prescription medicines only as told by your doctor. Some medicines are safe and some medicines are not safe during pregnancy.  Take a prenatal vitamin that contains at least 600 micrograms (mcg) of folic acid.  If you have trouble pooping (constipation), take medicine that will make your stool soft (stool softener) if your doctor approves. Eating and drinking   Eat regular, healthy meals.  Avoid raw meat and uncooked cheese.  If you get low calcium from the food you eat, talk to your doctor about taking a daily calcium supplement.  Eat four or five small meals rather than three large meals a day.  Avoid foods that are high in fat and sugars, such as fried and sweet foods.  To prevent constipation: ? Eat foods that are high in fiber, like fresh fruits and vegetables, whole grains, and beans. ? Drink enough  fluids to keep your pee (urine) clear or pale yellow. Activity  Exercise only as told by your doctor. Stop exercising if you start to have cramps.  Avoid heavy lifting, wear low heels, and sit up straight.  Do not exercise if it is too hot, too humid, or if you are in a place of great height (high altitude).  You may continue to have sex unless your doctor tells you not to. Relieving pain and discomfort  Wear a good support bra if your breasts are tender.  Take frequent breaks and rest with your legs raised if you have leg cramps or low back pain.  Take warm water baths (sitz baths) to soothe pain or discomfort caused by hemorrhoids. Use hemorrhoid cream if your doctor approves.  If you develop puffy, bulging veins (varicose veins) in your legs: ? Wear support  hose or compression stockings as told by your doctor. ? Raise (elevate) your feet for 15 minutes, 3-4 times a day. ? Limit salt in your food. Safety  Wear your seat belt when driving.  Make a list of emergency phone numbers, including numbers for family, friends, the hospital, and police and fire departments. Preparing for your baby's arrival To prepare for the arrival of your baby:  Take prenatal classes.  Practice driving to the hospital.  Visit the hospital and tour the maternity area.  Talk to your work about taking leave once the baby comes.  Pack your hospital bag.  Prepare the baby's room.  Go to your doctor visits.  Buy a rear-facing car seat. Learn how to install it in your car. General instructions  Do not use hot tubs, steam rooms, or saunas.  Do not use any products that contain nicotine or tobacco, such as cigarettes and e-cigarettes. If you need help quitting, ask your doctor.  Do not drink alcohol.  Do not douche or use tampons or scented sanitary pads.  Do not cross your legs for long periods of time.  Do not travel for long distances unless you must. Only do so if your doctor says it is  okay.  Visit your dentist if you have not gone during your pregnancy. Use a soft toothbrush to brush your teeth. Be gentle when you floss.  Avoid cat litter boxes and soil used by cats. These carry germs that can cause birth defects in the baby and can cause a loss of your baby (miscarriage) or stillbirth.  Keep all your prenatal visits as told by your doctor. This is important. Contact a doctor if:  You are not sure if you are in labor or if your water has broken.  You are dizzy.  You have mild cramps or pressure in your lower belly.  You have a nagging pain in your belly area.  You continue to feel sick to your stomach, you throw up, or you have watery poop.  You have bad smelling fluid coming from your vagina.  You have pain when you pee. Get help right away if:  You have a fever.  You are leaking fluid from your vagina.  You are spotting or bleeding from your vagina.  You have severe belly cramps or pain.  You lose or gain weight quickly.  You have trouble catching your breath and have chest pain.  You notice sudden or extreme puffiness (swelling) of your face, hands, ankles, feet, or legs.  You have not felt the baby move in over an hour.  You have severe headaches that do not go away with medicine.  You have trouble seeing.  You are leaking, or you are having a gush of fluid, from your vagina before you are 37 weeks.  You have regular belly spasms (contractions) before you are 37 weeks. Summary  The third trimester is from week 28 through week 40 (months 7 through 9). This time is when your unborn baby is growing very fast.  Follow your doctor's advice about medicine, food, and activity.  Get ready for the arrival of your baby by taking prenatal classes, getting all the baby items ready, preparing the baby's room, and visiting your doctor to be checked.  Get help right away if you are bleeding from your vagina, or you have chest pain and trouble catching  your breath, or if you have not felt your baby move in over an hour. This information is not  intended to replace advice given to you by your health care provider. Make sure you discuss any questions you have with your health care provider. Document Revised: 02/17/2019 Document Reviewed: 12/02/2016 Elsevier Patient Education  [place].

## 2020-10-23 LAB — CBC
Hematocrit: 34.1 % (ref 34.0–46.6)
Hemoglobin: 11.7 g/dL (ref 11.1–15.9)
MCH: 28 pg (ref 26.6–33.0)
MCHC: 34.3 g/dL (ref 31.5–35.7)
MCV: 82 fL (ref 79–97)
Platelets: 225 10*3/uL (ref 150–450)
RBC: 4.18 x10E6/uL (ref 3.77–5.28)
RDW: 12.4 % (ref 11.7–15.4)
WBC: 9 10*3/uL (ref 3.4–10.8)

## 2020-10-23 LAB — RPR: RPR Ser Ql: NONREACTIVE

## 2020-10-23 LAB — GLUCOSE, 1 HOUR GESTATIONAL: Gestational Diabetes Screen: 112 mg/dL (ref 65–139)

## 2020-11-05 ENCOUNTER — Other Ambulatory Visit: Payer: Self-pay

## 2020-11-05 ENCOUNTER — Ambulatory Visit (INDEPENDENT_AMBULATORY_CARE_PROVIDER_SITE_OTHER): Payer: 59 | Admitting: Certified Nurse Midwife

## 2020-11-05 ENCOUNTER — Encounter: Payer: Self-pay | Admitting: Certified Nurse Midwife

## 2020-11-05 VITALS — BP 104/67 | HR 75 | Wt 238.1 lb

## 2020-11-05 DIAGNOSIS — Z3483 Encounter for supervision of other normal pregnancy, third trimester: Secondary | ICD-10-CM

## 2020-11-05 DIAGNOSIS — Z3A30 30 weeks gestation of pregnancy: Secondary | ICD-10-CM

## 2020-11-05 LAB — POCT URINALYSIS DIPSTICK OB
Bilirubin, UA: NEGATIVE
Blood, UA: NEGATIVE
Glucose, UA: NEGATIVE
Ketones, UA: NEGATIVE
Leukocytes, UA: NEGATIVE
Nitrite, UA: NEGATIVE
POC,PROTEIN,UA: NEGATIVE
Spec Grav, UA: 1.01 (ref 1.010–1.025)
Urobilinogen, UA: 0.2 E.U./dL
pH, UA: 6.5 (ref 5.0–8.0)

## 2020-11-05 NOTE — Progress Notes (Signed)
ROB-Pt present for routine prenatal care. Pt stated having SOB and pain in the chest when she lend back.

## 2020-11-05 NOTE — Progress Notes (Signed)
ROB doing well. Feels good movement. Discussed consult VBAC counseling with MD at 34 wks. She verbalizes and agrees. She states she has some shortness of breath wirh taking stairs or activity. Denies issue at rest. Has so musculoskeletal discomfort. Reassurance given. Follow up 2 wk with Marcelino Duster, 4 wk with MD for [facility] counseling.   Doreene Burke, CNM

## 2020-11-05 NOTE — Patient Instructions (Signed)
Braxton Hicks Contractions °Contractions of the uterus can occur throughout pregnancy, but they are not always a sign that you are in labor. You may have practice contractions called Braxton Hicks contractions. These false labor contractions are sometimes confused with true labor. °What are Braxton Hicks contractions? °Braxton Hicks contractions are tightening movements that occur in the muscles of the uterus before labor. Unlike true labor contractions, these contractions do not result in opening (dilation) and thinning of the cervix. Toward the end of pregnancy (32-34 weeks), Braxton Hicks contractions can happen more often and may become stronger. These contractions are sometimes difficult to tell apart from true labor because they can be very uncomfortable. You should not feel embarrassed if you go to the hospital with false labor. °Sometimes, the only way to tell if you are in true labor is for your health care provider to look for changes in the cervix. The health care provider will do a physical exam and may monitor your contractions. If you are not in true labor, the exam should show that your cervix is not dilating and your water has not broken. °If there are no other health problems associated with your pregnancy, it is completely safe for you to be sent home with false labor. You may continue to have Braxton Hicks contractions until you go into true labor. °How to tell the difference between true labor and false labor °True labor °· Contractions last 30-70 seconds. °· Contractions become very regular. °· Discomfort is usually felt in the top of the uterus, and it spreads to the lower abdomen and low back. °· Contractions do not go away with walking. °· Contractions usually become more intense and increase in frequency. °· The cervix dilates and gets thinner. °False labor °· Contractions are usually shorter and not as strong as true labor contractions. °· Contractions are usually irregular. °· Contractions  are often felt in the front of the lower abdomen and in the groin. °· Contractions may go away when you walk around or change positions while lying down. °· Contractions get weaker and are shorter-lasting as time goes on. °· The cervix usually does not dilate or become thin. °Follow these instructions at home: ° °· Take over-the-counter and prescription medicines only as told by your health care provider. °· Keep up with your usual exercises and follow other instructions from your health care provider. °· Eat and drink lightly if you think you are going into labor. °· If Braxton Hicks contractions are making you uncomfortable: °? Change your position from lying down or resting to walking, or change from walking to resting. °? Sit and rest in a tub of warm water. °? Drink enough fluid to keep your urine pale yellow. Dehydration may cause these contractions. °? Do slow and deep breathing several times an hour. °· Keep all follow-up prenatal visits as told by your health care provider. This is important. °Contact a health care provider if: °· You have a fever. °· You have continuous pain in your abdomen. °Get help right away if: °· Your contractions become stronger, more regular, and closer together. °· You have fluid leaking or gushing from your vagina. °· You pass blood-tinged mucus (bloody show). °· You have bleeding from your vagina. °· You have low back pain that you never had before. °· You feel your baby’s head pushing down and causing pelvic pressure. °· Your baby is not moving inside you as much as it used to. °Summary °· Contractions that occur before labor are   called Braxton Hicks contractions, false labor, or practice contractions. °· Braxton Hicks contractions are usually shorter, weaker, farther apart, and less regular than true labor contractions. True labor contractions usually become progressively stronger and regular, and they become more frequent. °· Manage discomfort from Braxton Hicks contractions  by changing position, resting in a warm bath, drinking plenty of water, or practicing deep breathing. °This information is not intended to replace advice given to you by your health care provider. Make sure you discuss any questions you have with your health care provider. °Document Revised: 10/09/2017 Document Reviewed: 03/12/2017 °Elsevier Patient Education © 2020 [employer] Inc. ° °

## 2020-11-21 ENCOUNTER — Telehealth: Payer: Self-pay

## 2020-11-21 NOTE — Telephone Encounter (Signed)
mychart message sent to patient re: no visitors 

## 2020-11-22 ENCOUNTER — Other Ambulatory Visit: Payer: Self-pay

## 2020-11-22 ENCOUNTER — Ambulatory Visit (INDEPENDENT_AMBULATORY_CARE_PROVIDER_SITE_OTHER): Payer: BC Managed Care – PPO | Admitting: Certified Nurse Midwife

## 2020-11-22 VITALS — BP 105/72 | HR 84 | Wt 238.6 lb

## 2020-11-22 DIAGNOSIS — Z3483 Encounter for supervision of other normal pregnancy, third trimester: Secondary | ICD-10-CM

## 2020-11-22 DIAGNOSIS — Z3A32 32 weeks gestation of pregnancy: Secondary | ICD-10-CM

## 2020-11-22 DIAGNOSIS — L299 Pruritus, unspecified: Secondary | ICD-10-CM

## 2020-11-22 LAB — POCT URINALYSIS DIPSTICK OB
Bilirubin, UA: NEGATIVE
Blood, UA: NEGATIVE
Glucose, UA: NEGATIVE
Ketones, UA: NEGATIVE
Leukocytes, UA: NEGATIVE
Nitrite, UA: NEGATIVE
POC,PROTEIN,UA: NEGATIVE
Spec Grav, UA: 1.015 (ref 1.010–1.025)
Urobilinogen, UA: 0.2 E.U./dL
pH, UA: 6.5 (ref 5.0–8.0)

## 2020-11-22 NOTE — Progress Notes (Signed)
Discussed risks vs benefits of TOLAC vs repeat C-section.  Risk of uterine rupture at term is ~ 1%.  Risk of failed trial of labor after cesarean (TOLAC) without a vaginal birth after cesarean (VBAC) resulting in repeat cesarean delivery (RCD) in about 20 to 40 percent of women who attempt VBAC.  Risk of requiring emergency C-section due to uterine rupture discussed. The benefits of a trial of labor after cesarean (TOLAC) resulting in a vaginal birth after cesarean (VBAC) include the following: shorter length of hospital stay and postpartum recovery (in most cases); fewer complications, such as postpartum fever, wound or uterine infection, thromboembolism (blood clots in the leg or lung), need for blood transfusion and fewer neonatal breathing problems.  Patient notes understanding.  Still desires TOLAC.  Patient scheduled to meet with MD next week for official [facility] consultation.   I have seen, interviewed, and examined the patient in conjunction with the [employer] and affirm the diagnosis and management plan.   Gunnar Bulla, CNM [facility] 11/22/20 11:50 AM

## 2020-11-22 NOTE — Progress Notes (Signed)
ROB- Reports hand, foot and side itching as well as nightly bilateral wrist pain and swelling. Discussed home treatment measures. CMP and Bile Acids today, see orders. TOLAC discussed with CNM, see note. Anticipatory guidance regarding course of prenatal care. Reviewed red flag symptoms and when to call. RTC as previously scheduled or sooner if needed  Juliann Pares, Student-MidWife [employer] 11/22/20 9:21 AM

## 2020-11-22 NOTE — Patient Instructions (Addendum)
[employer]'s Emergency Medicine: Concepts and Clinical Practice (9th ed., pp. 2296- 2312). Elsevier.">  Braxton Hicks Contractions Contractions of the uterus can occur throughout pregnancy, but they are not always a sign that you are in labor. You may have practice contractions called Braxton Hicks contractions. These false labor contractions are sometimes confused with true labor. What are Deberah Pelton contractions? Braxton Hicks contractions are tightening movements that occur in the muscles of the uterus before labor. Unlike true labor contractions, these contractions do not result in opening (dilation) and thinning of the cervix. Toward the end of pregnancy (32-34 weeks), Braxton Hicks contractions can happen more often and may become stronger. These contractions are sometimes difficult to tell apart from true labor because they can be very uncomfortable. You should not feel embarrassed if you go to the hospital with false labor. Sometimes, the only way to tell if you are in true labor is for your health care provider to look for changes in the cervix. The health care provider will do a physical exam and may monitor your contractions. If you are not in true labor, the exam should show that your cervix is not dilating and your water has not broken. If there are no other health problems associated with your pregnancy, it is completely safe for you to be sent home with false labor. You may continue to have Braxton Hicks contractions until you go into true labor. How to tell the difference between true labor and false labor True labor  Contractions last 30-70 seconds.  Contractions become very regular.  Discomfort is usually felt in the top of the uterus, and it spreads to the lower abdomen and low back.  Contractions do not go away with walking.  Contractions usually become more intense and increase in frequency.  The cervix dilates and gets thinner. False labor  Contractions are usually  shorter and not as strong as true labor contractions.  Contractions are usually irregular.  Contractions are often felt in the front of the lower abdomen and in the groin.  Contractions may go away when you walk around or change positions while lying down.  Contractions get weaker and are shorter-lasting as time goes on.  The cervix usually does not dilate or become thin. Follow these instructions at home:  Take over-the-counter and prescription medicines only as told by your health care provider.  Keep up with your usual exercises and follow other instructions from your health care provider.  Eat and drink lightly if you think you are going into labor.  If Braxton Hicks contractions are making you uncomfortable: ? Change your position from lying down or resting to walking, or change from walking to resting. ? Sit and rest in a tub of warm water. ? Drink enough fluid to keep your urine pale yellow. Dehydration may cause these contractions. ? Do slow and deep breathing several times an hour.  Keep all follow-up prenatal visits as told by your health care provider. This is important.   Contact a health care provider if:  You have a fever.  You have continuous pain in your abdomen. Get help right away if:  Your contractions become stronger, more regular, and closer together.  You have fluid leaking or gushing from your vagina.  You pass blood-tinged mucus (bloody show).  You have bleeding from your vagina.  You have low back pain that you never had before.  You feel your baby's head pushing down and causing pelvic pressure.  Your baby is not  moving inside you as much as it used to. Summary  Contractions that occur before labor are called Braxton Hicks contractions, false labor, or practice contractions.  Braxton Hicks contractions are usually shorter, weaker, farther apart, and less regular than true labor contractions. True labor contractions usually become  progressively stronger and regular, and they become more frequent.  Manage discomfort from [facility] contractions by changing position, resting in a warm bath, drinking plenty of water, or practicing deep breathing. This information is not intended to replace advice given to you by your health care provider. Make sure you discuss any questions you have with your health care provider. Document Revised: 10/09/2017 Document Reviewed: 03/12/2017 Elsevier Patient Education  2021 [employer].     Third Trimester of Pregnancy  The third trimester of pregnancy is from week 28 through week 40. This is also called months 7 through 9. This trimester is when your unborn baby (fetus) is growing very fast. At the end of the ninth month, the unborn baby is about 20 inches long. It weighs about 6-10 pounds. Body changes during your third trimester Your body continues to go through many changes during this time. The changes vary and generally return to normal after the baby is born. Physical changes  Your weight will continue to increase. You may gain 25-35 pounds (11-16 kg) by the end of the pregnancy. If you are underweight, you may gain 28-40 lb (about 13-18 kg). If you are overweight, you may gain 15-25 lb (about 7-11 kg).  You may start to get stretch marks on your hips, belly (abdomen), and breasts.  Your breasts will continue to grow and may hurt. A yellow fluid (colostrum) may leak from your breasts. This is the first milk you are making for your baby.  You may have changes in your hair.  Your belly button may stick out.  You may have more swelling in your hands, face, or ankles. Health changes  You may have heartburn.  You may have trouble pooping (constipation).  You may get hemorrhoids. These are swollen veins in the butt that can itch or get painful.  You may have swollen veins (varicose veins) in your legs.  You may have more body aches in the pelvis, back, or thighs.  You  may have more tingling or numbness in your hands, arms, and legs. The skin on your belly may also feel numb.  You may feel short of breath as your womb (uterus) gets bigger. Other changes  You may pee (urinate) more often.  You may have more problems sleeping.  You may notice the unborn baby "dropping," or moving lower in your belly.  You may have more discharge coming from your vagina.  Your joints may feel loose, and you may have pain around your pelvic bone. Follow these instructions at home: Medicines  Take over-the-counter and prescription medicines only as told by your doctor. Some medicines are not safe during pregnancy.  Take a prenatal vitamin that contains at least 600 micrograms (mcg) of folic acid. Eating and drinking  Eat healthy meals that include: ? Fresh fruits and vegetables. ? Whole grains. ? Good sources of protein, such as meat, eggs, or tofu. ? Low-fat dairy products.  Avoid raw meat and unpasteurized juice, milk, and cheese. These carry germs that can harm you and your baby.  Eat 4 or 5 small meals rather than 3 large meals a day.  You may need to take these actions to prevent or treat trouble pooping: ?  Drink enough fluids to keep your pee (urine) pale yellow. ? Eat foods that are high in fiber. These include beans, whole grains, and fresh fruits and vegetables. ? Limit foods that are high in fat and sugar. These include fried or sweet foods. Activity  Exercise only as told by your doctor. Stop exercising if you start to have cramps in your womb.  Avoid heavy lifting.  Do not exercise if it is too hot or too humid, or if you are in a place of great height (high altitude).  If you choose to, you may have sex unless your doctor tells you not to. Relieving pain and discomfort  Take breaks often, and rest with your legs raised (elevated) if you have leg cramps or low back pain.  Take warm water baths (sitz baths) to soothe pain or discomfort caused  by hemorrhoids. Use hemorrhoid cream if your doctor approves.  Wear a good support bra if your breasts are tender.  If you develop bulging, swollen veins in your legs: ? Wear support hose as told by your doctor. ? Raise your feet for 15 minutes, 3-4 times a day. ? Limit salt in your food. Safety  Talk to your doctor before traveling far distances.  Do not use hot tubs, steam rooms, or saunas.  Wear your seat belt at all times when you are in a car.  Talk with your doctor if someone is hurting you or yelling at you a lot. Preparing for your baby's arrival To prepare for the arrival of your baby:  Take prenatal classes.  Visit the hospital and tour the maternity area.  Buy a rear-facing car seat. Learn how to install it in your car.  Prepare the baby's room. Take out all pillows and stuffed animals from the baby's crib. General instructions  Avoid cat litter boxes and soil used by cats. These carry germs that can cause harm to the baby and can cause a loss of your baby by miscarriage or stillbirth.  Do not douche or use tampons. Do not use scented sanitary pads.  Do not smoke or use any products that contain nicotine or tobacco. If you need help quitting, ask your doctor.  Do not drink alcohol.  Do not use herbal medicines, illegal drugs, or medicines that were not approved by your doctor. Chemicals in these products can affect your baby.  Keep all follow-up visits. This is important. Where to find more information  American Pregnancy Association: americanpregnancy.org  [employer] of Obstetricians and Gynecologists: www.acog.org  Office on Women's Health: [employer].nz Contact a doctor if:  You have a fever.  You have mild cramps or pressure in your lower belly.  You have a nagging pain in your belly area.  You vomit, or you have watery poop (diarrhea).  You have bad-smelling fluid coming from your vagina.  You have pain when you pee, or  your pee smells bad.  You have a headache that does not go away when you take medicine.  You have changes in how you see, or you see spots in front of your eyes. Get help right away if:  Your water breaks.  You have regular contractions that are less than 5 minutes apart.  You are spotting or bleeding from your vagina.  You have very bad belly cramps or pain.  You have trouble breathing.  You have chest pain.  You faint.  You have not felt the baby move for the amount of time told by your doctor.  You have new or increased pain, swelling, or redness in an arm or leg. Summary  The third trimester is from week 28 through week 40 (months 7 through 9). This is the time when your unborn baby is growing very fast.  During this time, your discomfort may increase as you gain weight and as your baby grows.  Get ready for your baby to arrive by taking prenatal classes, buying a rear-facing car seat, and preparing the baby's room.  Get help right away if you are bleeding from your vagina, you have chest pain and trouble breathing, or you have not felt the baby move for the amount of time told by your doctor. This information is not intended to replace advice given to you by your health care provider. Make sure you discuss any questions you have with your health care provider. Document Revised: 04/04/2020 Document Reviewed: 02/09/2020 Elsevier Patient Education  2021 Elsevier Inc. Carpal Tunnel Syndrome  Carpal tunnel syndrome is a condition that causes pain, weakness, and numbness in your hand and arm. Numbness is when you cannot feel an area in your body. The carpal tunnel is a narrow area that is on the palm side of your wrist. Repeated wrist motion or certain diseases may cause swelling in the tunnel. This swelling can pinch the main nerve in the wrist. This nerve is called the median nerve. What are the causes? This condition may be caused by:  Moving your hand and wrist over and  over again while doing a task.  Injury to the wrist.  Arthritis.  A sac of fluid (cyst) or abnormal growth (tumor) in the carpal tunnel.  Fluid buildup during pregnancy.  Use of tools that vibrate. Sometimes the cause is not known. What increases the risk? The following factors may make you more likely to have this condition:  Having a job that makes you do these things: ? Move your hand over and over again. ? Work with tools that vibrate, such as drills or sanders.  Being a woman.  Having diabetes, obesity, thyroid problems, or kidney failure. What are the signs or symptoms? Symptoms of this condition include:  A tingling feeling in your fingers.  Tingling or loss of feeling in your hand.  Pain in your entire arm. This pain may get worse when you bend your wrist and elbow for a long time.  Pain in your wrist that goes up your arm to your shoulder.  Pain that goes down into your palm or fingers.  Weakness in your hands. You may find it hard to grab and hold items. You may feel worse at night. How is this treated? This condition may be treated with:  Lifestyle changes. You will be asked to stop or change the activity that caused your problem.  Doing exercises and activities that make bones, muscles, and tendons stronger (physical therapy).  Learning how to use your hand again (occupational therapy).  Medicines for pain and swelling. You may have injections in your wrist.  A wrist splint or brace.  Surgery. Follow these instructions at home: If you have a splint or brace:  Wear the splint or brace as told by your doctor. Take it off only as told by your doctor.  Loosen the splint if your fingers: ? Tingle. ? Become numb. ? Turn cold and blue.  Keep the splint or brace clean.  If the splint or brace is not waterproof: ? Do not let it get wet. ? Cover it with a watertight  covering when you take a bath or a shower. Managing pain, stiffness, and  swelling If told, put ice on the painful area:  If you have a removable splint or brace, remove it as told by your doctor.  Put ice in a plastic bag.  Place a towel between your skin and the bag.  Leave the ice on for 20 minutes, 2-3 times per day. Do not fall asleep with the cold pack on your skin.  Take off the ice if your skin turns bright red. This is very important. If you cannot feel pain, heat, or cold, you have a greater risk of damage to the area. Move your fingers often to reduce stiffness and swelling.   General instructions  Take over-the-counter and prescription medicines only as told by your doctor.  Rest your wrist from any activity that may cause pain. If needed, talk with your boss at work about changes that can help your wrist heal.  Do exercises as told by your doctor, physical therapist, or occupational therapist.  Keep all follow-up visits. Contact a doctor if:  You have new symptoms.  Medicine does not help your pain.  Your symptoms get worse. Get help right away if:  You have very bad numbness or tingling in your wrist or hand. Summary  Carpal tunnel syndrome is a condition that causes pain in your hand and arm.  It is often caused by repeated wrist motions.  Lifestyle changes and medicines are used to treat this problem. Surgery may help in very bad cases.  Follow your doctor's instructions about wearing a splint, resting your wrist, keeping follow-up visits, and calling for help. This information is not intended to replace advice given to you by your health care provider. Make sure you discuss any questions you have with your health care provider. Document Revised: 03/08/2020 Document Reviewed: 03/08/2020 Elsevier Patient Education  2021 Elsevier Inc.  Vaginal Birth After Cesarean Delivery  Vaginal birth after cesarean delivery (VBAC) is giving birth vaginally after previously delivering a baby through a cesarean section (C-section). A VBAC may  be a safe option for you, depending on your health and other factors. It is important to discuss VBAC with your health care provider early in your pregnancy so you can understand the risks, benefits, and options. Having these discussions early will give you time to make your birth plan. Who are the best candidates for VBAC? The best candidates for VBAC are women who:  Have had one or two prior cesarean deliveries, and the incision made during the delivery was horizontal (low transverse).  Do not have a vertical (classical) scar on their uterus.  Have not had a tear in the wall of their uterus (uterine rupture).  Plan to have more pregnancies. A VBAC is also more likely to be successful:  In women who have previously given birth vaginally.  When labor starts by itself (spontaneously) before the due date. What are the benefits of VBAC? The benefits of delivering your baby vaginally instead of by a cesarean delivery include:  A shorter hospital stay.  A faster recovery time.  Less pain.  Avoiding risks associated with major surgery, such as infection and blood clots.  Less blood loss and less need for donated blood (transfusions). What are the risks of VBAC? The main risk of attempting a VBAC is that it may fail, forcing your health care provider to deliver your baby by a C-section. Other risks are rare and include:  Tearing (rupture) of the scar  from a past cesarean delivery.  Other risks associated with vaginal deliveries. If a repeat cesarean delivery is needed, the risks include:  Blood loss.  Infection.  Blood clot.  Damage to surrounding organs.  Removal of the uterus (hysterectomy), if it is damaged.  Placenta problems in future pregnancies. What else should I know about my options? Delivering a baby through a VBAC is similar to having a normal spontaneous vaginal delivery. Therefore, it is safe:  To try with twins.  For your health care provider to try to  turn the baby from a breech position (external cephalic version) during labor.  With epidural analgesia for pain relief. Consider where you would like to deliver your baby. VBAC should be attempted in facilities where an emergency cesarean delivery can be performed. VBAC is not recommended for home births. Any changes in your health or your baby's health during your pregnancy may make it necessary to change your initial decision about VBAC. Your health care provider may recommend that you do not attempt a VBAC if:  Your baby's suspected weight is 8.8 lb (4 kg) or more.  You have preeclampsia. This is a condition that causes high blood pressure along with other symptoms, such as swelling and headaches.  You will have VBAC less than 19 months after your cesarean delivery.  You are past your due date.  You need to have labor started (induced) because your cervix is not ready for labor (unfavorable). Where to find more information  American Pregnancy Association: americanpregnancy.org  [employer] of Obstetricians and Gynecologists: acog.org Summary  Vaginal birth after cesarean delivery (VBAC) is giving birth vaginally after previously delivering a baby through a cesarean section (C-section). A VBAC may be a safe option for you, depending on your health and other factors.  Discuss VBAC with your health care provider early in your pregnancy so you can understand the risks, benefits, options, and have plenty of time to make your birth plan.  The main risk of attempting a VBAC is that it may fail, forcing your health care provider to deliver your baby by a C-section. Other risks are rare. This information is not intended to replace advice given to you by your health care provider. Make sure you discuss any questions you have with your health care provider. Document Revised: 02/22/2019 Document Reviewed: 02/03/2017 Elsevier Patient Education  2021 [employer].  Preparing for Vaginal  Birth After Cesarean Delivery Vaginal birth after cesarean delivery (VBAC) is giving birth vaginally after previously delivering a baby through a cesarean section (C-section). You and your health are provider will discuss your options and whether you may be a good candidate for VBAC. What are my options? After a cesarean delivery, your options for future deliveries may include:  Scheduled repeat cesarean delivery. This is done in a hospital with an operating room.  Trial of labor after cesarean (TOLAC). A successful TOLAC results in a vaginal delivery. If it is not successful, you will need to have a cesarean delivery. TOLAC should be attempted in facilities where an emergency cesarean delivery can be performed. It should not be done as a home birth. Talk with your health care provider about the risks and benefits of each option early in your pregnancy. The best option for you will depend on your preferences and your overall health as well as your baby's. What should I know about my past cesarean delivery? It is important to know what type of incision was made in your uterus in a past  cesarean delivery. The type of incision can affect the success of your TOLAC. Types of incisions include:  Low transverse. This is a side-to-side cut low on your uterus. The scar on your skin looks like a horizontal line just above your pubic area. This type of cut is the most common and makes you a good candidate for TOLAC.  Low vertical. This is an up-and-down cut low on your uterus. The scar on your skin looks like a vertical line between your pubic area and belly button. This type of cut puts you at higher risk for problems during TOLAC.  High vertical or classical. This is an up-and-down cut high on your uterus. The scar on your skin looks like a vertical line that runs over the top of your belly button. This type of cut has the highest risk for problems and usually means that TOLAC is not an option.   When is  VBAC not an option? As you progress through your pregnancy, circumstances may change and you may need to reconsider your options. Your situation may also change even as you begin TOLAC. Your health care provider may not want you to attempt a VBAC if you:  Need to have labor started (induced) because your cervix is not ready for labor.  Have never had a vaginal delivery.  Have had more than two cesarean deliveries.  Are overdue.  Are pregnant with a very large baby.  Have a condition that causes high blood pressure (preeclampsia). Questions to ask your health care provider  Am I a good candidate for TOLAC?  What are my chances of a successful vaginal delivery?  Is my preferred birth location equipped for a TOLAC?  What are my pain management options during a TOLAC? Where to find more information  American Congress of Obstetricians and Gynecologists: www.acog.org  [employer] of Nurse-Midwives: www.midwife.org Summary  Vaginal birth after cesarean delivery (VBAC) is giving birth vaginally after previously delivering a baby through a cesarean section (C-section).  VBAC may be a safe and appropriate option for you depending on your medical history and other risk factors. Talk with your health care provider about the options available to you, and the risks and benefits of each early in your pregnancy.  TOLAC should be attempted in facilities where emergency cesarean section procedures can be performed. This information is not intended to replace advice given to you by your health care provider. Make sure you discuss any questions you have with your health care provider. Document Revised: 02/22/2019 Document Reviewed: 02/05/2017 Elsevier Patient Education  2021 Elsevier Inc.  Skin Conditions During Pregnancy Pregnancy affects many parts of the human body. One part is the skin. Most skin problems that develop during pregnancy are not serious and are considered a normal part of  pregnancy. Many skin problems go away on their own after the baby is born. What type of skin problems can develop during pregnancy? Common skin conditions  Stretch marks. Stretch marks are purple or pink lines on the skin. They may appear on the belly, breasts, thighs, or buttocks. Stretch marks are caused by weight gain, which causes the skin to stretch. Stretch marks do not cause problems. Almost all women get them during pregnancy. Most stretch marks fade after the baby is born but may not disappear completely.  Darkening of the skin, called hyperpigmentation. The darkening may occur in patches or as a line. Patches may appear on the face (melasma), nipples, or genital area. A dark line may also form  and stretch from the belly button to the pubic area (linea nigra). Hyperpigmentation develops in almost all pregnant women. It is more severe in women with a dark complexion. Most hyperpigmentation fades after the baby is born.  Spider angiomas or spider veins. These are tiny pink or red lines that go out from a center point, like the legs of a spider. Usually, they are on the face, neck, and arms. They do not cause problems. They are most common in women with light complexions. Spider veins usually fade after the baby is born.  Varicose veins. Excess blood volume during pregnancy can cause veins to enlarge. They can look like swollen veins above the surface of the skin and are usually red or purple. They are usually found on the legs. They may also be found on the buttocks, where they are called hemorrhoids. In most cases, the varicose veins go away after the baby is born.  Pruritic urticarial papules and plaques of pregnancy (PUPPP rash). This is an itchy, red rash that has tiny bumps. The cause is unknown. It generally starts on the abdomen and may affect the arms or legs. It usually begins later in pregnancy. The rash is not known to affect the unborn baby. Sometimes, topical steroids are used to  soothe the itch. The rash clears after the baby is born. Uncommon skin conditions  Pemphigoid gestationis. This is a very rare autoimmune disease. It causes a severely itchy rash and blisters. It develops in the second or third trimester of pregnancy or immediately after pregnancy (postpartum). The rash usually appears on the abdomen, buttocks, arms, and legs. It usually goes away within 3 months after delivery. It may return (recur) with future pregnancies. It has also been associated with an increased risk of Graves' disease. Sometimes the rash can increase risk of complications for the unborn baby. The baby may be born early, may be born with blisters, or may have low birth weight.  Pruritic folliculitis of pregnancy. This is a rare condition that causes pimple-like skin growths. It develops in the middle or later stages of pregnancy. The cause of this condition is not known. It usually goes away 2-3 weeks after delivery but can recur with future pregnancies. The rash does not affect the unborn baby.  Intrahepatic cholestasis of pregnancy. This is a rare liver condition that causes itchy skin, but not a rash. It usually occurs on the palms of the hands and soles of the feet but may spread to the abdomen. It usually starts in the third trimester and can increase the risk of complications for the unborn baby. This condition usually heals after delivery, but it can recur in future pregnancies. It may run in families (inherited).  Pustular psoriasis of pregnancy, also known as impetigo herpetiformis. This is a form of a severe skin disease. It causes many translucent, white bumps that may form a crust when they burst. It usually occurs in the third trimester. It may cause complications in the mother. It can also increase the risk of complications in the unborn baby (low birth weight and death). The condition usually heals after delivery but can recur in future pregnancies.  Palmar erythema. This is a  reddening of the palms. It is most common in women with light complexions. It usually fades away after the baby is born.  Prurigo of pregnancy. This is a disease in which itchy red patches and bumps appear on the body. This condition can happen any time during pregnancy. Usually, it starts  as a few bumps but increases each day. The cause is unknown. The patches and bumps clear after the baby is born. This condition can recur with future pregnancies. The rash does not affect the unborn baby. Other skin conditions  Swelling and redness. This can occur on the face, eyelids, ankles, fingers, or toes.  Acne. Pimples may develop, including in women who have had clear skin for a long time.  Skin tags. These are small flaps of skin that stick out from the body. They may grow or become darker during pregnancy. They are usually harmless. They do not go away on their own but can be removed by a health care provider.  Moles. These are flat or slightly raised growths. They are usually round and pink or brown. They may grow or become darker during pregnancy. Some skin problems that were there before pregnancy (pre-existing skin conditions), such as atopic dermatitis or psoriasis, may become worse during pregnancy. What actions can I take to manage or treat these conditions? Different conditions may have different instructions for care. In general:  Follow all your health care provider's directions about medicines to treat skin problems while you are pregnant. Do not use over-the-counter medicines, creams, or lotions until you have checked with your health care provider. Many medicines are not safe to use when you are pregnant.  Limit time in the sun. This will help keep your skin from darkening. When you must be outside, use sunscreen and wear a hat with a wide brim to protect your face. The sunscreen should have an SPF of at least 15.  Use a gentle soap. This helps prevent skin irritation.  Avoid getting  too hot or too sweaty. This makes some skin rashes worse.  Wear loose clothing made of a soft fabric. This prevents skin irritation.  Use a skin moisturizer. Ask your health care provider for suggestions.  Exercise regularly, if possible. Ask your health care provider about the activities that are safe for you. Healthy pregnant women should aim for 30 minutes of moderate exercise most days of the week. Examples of moderate exercise include walking or swimming. This can help reduce several pregnancy side effects, including varicose veins.  Keep all follow-up visits. This is important.      Contact a health care provider if:  Your rash is causing discomfort such as itching or pain. Get help right away if:  You do not feel movement of your baby as expected. Summary  Most skin problems that develop during pregnancy are not serious and are considered a normal part of pregnancy. They usually go away on their own after the baby is born.  Some common skin problems include stretch marks, darkening of the skin, spider veins, and varicose veins.  Follow all your health care provider's directions about medicines to treat skin problems while you are pregnant. Do not use any over-the-counter medicines, creams, or lotions until you have checked with your health care provider.  To prevent and manage skin problems, use gentle soap, try not to get too hot or sweaty, wear loose-fitting clothing, and limit time in the sun.  Some pre-existing skin conditions, such as atopic dermatitis or psoriasis, may become worse during pregnancy. This information is not intended to replace advice given to you by your health care provider. Make sure you discuss any questions you have with your health care provider. Document Revised: 07/02/2020 Document Reviewed: 07/02/2020 Elsevier Patient Education  2021 [employer].

## 2020-11-24 LAB — COMPREHENSIVE METABOLIC PANEL
ALT: 9 IU/L (ref 0–32)
AST: 11 IU/L (ref 0–40)
Albumin/Globulin Ratio: 1.2 (ref 1.2–2.2)
Albumin: 3.3 g/dL — ABNORMAL LOW (ref 3.9–5.0)
Alkaline Phosphatase: 110 IU/L (ref 44–121)
BUN/Creatinine Ratio: 8 — ABNORMAL LOW (ref 9–23)
BUN: 4 mg/dL — ABNORMAL LOW (ref 6–20)
Bilirubin Total: <0.2 mg/dL (ref 0.0–1.2)
CO2: 18 mmol/L — ABNORMAL LOW (ref 20–29)
Calcium: 8.6 mg/dL — ABNORMAL LOW (ref 8.7–10.2)
Chloride: 105 mmol/L (ref 96–106)
Creatinine, Ser: 0.51 mg/dL — ABNORMAL LOW (ref 0.57–1.00)
GFR calc Af Amer: 155 mL/min/{1.73_m2} (ref >59)
GFR calc non Af Amer: 134 mL/min/{1.73_m2} (ref >59)
Globulin, Total: 2.7 g/dL (ref 1.5–4.5)
Glucose: 75 mg/dL (ref 65–99)
Potassium: 4.3 mmol/L (ref 3.5–5.2)
Sodium: 140 mmol/L (ref 134–144)
Total Protein: 6 g/dL (ref 6.0–8.5)

## 2020-11-24 LAB — BILE ACIDS, TOTAL: Bile Acids Total: 1 umol/L (ref 0.0–10.0)

## 2020-11-28 ENCOUNTER — Encounter: Payer: 59 | Admitting: Obstetrics and Gynecology

## 2020-11-29 ENCOUNTER — Encounter: Payer: Self-pay | Admitting: Obstetrics and Gynecology

## 2020-11-29 ENCOUNTER — Ambulatory Visit (INDEPENDENT_AMBULATORY_CARE_PROVIDER_SITE_OTHER): Payer: BC Managed Care – PPO | Admitting: Obstetrics and Gynecology

## 2020-11-29 ENCOUNTER — Other Ambulatory Visit: Payer: Self-pay

## 2020-11-29 VITALS — BP 124/69 | HR 87 | Wt 242.5 lb

## 2020-11-29 DIAGNOSIS — Z6841 Body Mass Index (BMI) 40.0 and over, adult: Secondary | ICD-10-CM

## 2020-11-29 DIAGNOSIS — O34219 Maternal care for unspecified type scar from previous cesarean delivery: Secondary | ICD-10-CM

## 2020-11-29 DIAGNOSIS — Z3A33 33 weeks gestation of pregnancy: Secondary | ICD-10-CM

## 2020-11-29 DIAGNOSIS — O0993 Supervision of high risk pregnancy, unspecified, third trimester: Secondary | ICD-10-CM

## 2020-11-29 LAB — POCT URINALYSIS DIPSTICK OB
Bilirubin, UA: NEGATIVE
Blood, UA: NEGATIVE
Glucose, UA: NEGATIVE
Ketones, UA: NEGATIVE
Leukocytes, UA: NEGATIVE
Nitrite, UA: NEGATIVE
POC,PROTEIN,UA: NEGATIVE
Spec Grav, UA: 1.01 (ref 1.010–1.025)
Urobilinogen, UA: 0.2 E.U./dL
pH, UA: 7 (ref 5.0–8.0)

## 2020-11-29 NOTE — Patient Instructions (Signed)
Preparing for Vaginal Birth After Cesarean Delivery Vaginal birth after cesarean delivery (VBAC) is giving birth vaginally after previously delivering a baby through a cesarean section (C-section). You and your health are provider will discuss your options and whether you may be a good candidate for VBAC. What are my options? After a cesarean delivery, your options for future deliveries may include:  Scheduled repeat cesarean delivery. This is done in a hospital with an operating room.  Trial of labor after cesarean (TOLAC). A successful TOLAC results in a vaginal delivery. If it is not successful, you will need to have a cesarean delivery. TOLAC should be attempted in facilities where an emergency cesarean delivery can be performed. It should not be done as a home birth. Talk with your health care provider about the risks and benefits of each option early in your pregnancy. The best option for you will depend on your preferences and your overall health as well as your baby's. What should I know about my past cesarean delivery? It is important to know what type of incision was made in your uterus in a past cesarean delivery. The type of incision can affect the success of your TOLAC. Types of incisions include:  Low transverse. This is a side-to-side cut low on your uterus. The scar on your skin looks like a horizontal line just above your pubic area. This type of cut is the most common and makes you a good candidate for TOLAC.  Low vertical. This is an up-and-down cut low on your uterus. The scar on your skin looks like a vertical line between your pubic area and belly button. This type of cut puts you at higher risk for problems during TOLAC.  High vertical or classical. This is an up-and-down cut high on your uterus. The scar on your skin looks like a vertical line that runs over the top of your belly button. This type of cut has the highest risk for problems and usually means that TOLAC is not an  option.   When is VBAC not an option? As you progress through your pregnancy, circumstances may change and you may need to reconsider your options. Your situation may also change even as you begin TOLAC. Your health care provider may not want you to attempt a VBAC if you:  Need to have labor started (induced) because your cervix is not ready for labor.  Have never had a vaginal delivery.  Have had more than two cesarean deliveries.  Are overdue.  Are pregnant with a very large baby.  Have a condition that causes high blood pressure (preeclampsia). Questions to ask your health care provider  Am I a good candidate for TOLAC?  What are my chances of a successful vaginal delivery?  Is my preferred birth location equipped for a TOLAC?  What are my pain management options during a TOLAC? Where to find more information  [employer] of Obst[employer]ians and Gynec[employer]: www.acog.org  [employer]: www.midwife.org Summary  Vaginal birth after cesarean delivery (VBAC) is giving birth vaginally after previously delivering a baby through a cesarean section (C-section).  VBAC may be a safe and appropriate option for you depending on your medical history and other risk factors. Talk with your health care provider about the options available to you, and the risks and benefits of each early in your pregnancy.  TOLAC should be attempted in facilities where emergency cesarean section procedures can be performed. This information is not intended to replace advice given to  you by your health care provider. Make sure you discuss any questions you have with your health care provider. Document Revised: 02/22/2019 Document Reviewed: 02/05/2017 Elsevier Patient Education  2021 [employer].

## 2020-11-29 NOTE — Progress Notes (Signed)
ROB: Vanessa Mullins is a 25 y.o. G2P1001 at [redacted]w[redacted]d with Estimated Date of Delivery: 01/14/21 who was seen as a referral from midwifery service to discuss trial of labor after cesarean section (TOLAC) versus elective repeat cesarean delivery (ERCD). Has a h/o scheduled C/S x 1 for breech presentation. The following risks were discussed with the patient.  Risk of uterine rupture at term is 0.78 percent with TOLAC and 0.22 percent with ERCD. 1 in 10 uterine ruptures will result in neonatal death or neurological injury. The benefits of a trial of labor after cesarean (TOLAC) resulting in a vaginal birth after cesarean (VBAC) include the following: shorter length of hospital stay and postpartum recovery (in most cases); fewer complications, such as postpartum fever, wound or uterine infection, thromboembolism (blood clots in the leg or lung), need for blood transfusion and fewer neonatal breathing problems. The risks of an attempted VBAC or TOLAC include the following: . Risk of failed trial of labor after cesarean (TOLAC) without a vaginal birth after cesarean (VBAC) resulting in repeat cesarean delivery (RCD) in about 20 to 40 percent of women who attempt VBAC.  Marland Kitchen Risk of rupture of uterus resulting in an emergency cesarean delivery. The risk of uterine rupture may be related in part to the type of uterine incision made during the first cesarean delivery. A previous transverse uterine incision has the lowest risk of rupture (0.2 to 1.5 percent risk). Vertical or T-shaped uterine incisions have a higher risk of uterine rupture (4 to 9 percent risk)The risk of fetal death is very low with both VBAC and elective repeat cesarean delivery (ERCD), but the likelihood of fetal death is higher with VBAC than with ERCD. Maternal death is very rare with either type of delivery. The risks of an elective repeat cesarean delivery (ERCD) were reviewed with the patient including but not limited to: 12/998 risk of uterine  rupture which could have serious consequences, bleeding which may require transfusion; infection which may require antibiotics; injury to bowel, bladder or other surrounding organs (bowel, bladder, ureters); injury to the fetus; need for additional procedures including hysterectomy in the event of a life-threatening hemorrhage; thromboembolic phenomenon; abnormal placentation; incisional problems; death and other postoperative or anesthesia complications.    These risks and benefits are summarized on the consent form, which was reviewed with the patient during the visit.  All her questions were answered and she signed a consent indicating a preference for TOLAC.  VBAC calculator score is 57.3%.  A copy of the consent was given to the patient.    Will continue routine postpartum care. RTC in 2 weeks with midwifery service.

## 2020-11-29 NOTE — Progress Notes (Signed)
ROB-Pt present to discuss possible c-section. Pt stated that her last pregnancy was a c-section. Pt voiced that she was doing well other than having vaginal/anus  Pressure and pain.

## 2020-12-04 ENCOUNTER — Telehealth: Payer: Self-pay

## 2020-12-04 NOTE — Telephone Encounter (Signed)
mychart message sent to patient

## 2020-12-04 NOTE — Telephone Encounter (Signed)
Pt called in and stated that her son had to get tested for covid. The pt is have some sinus issues but nothing else. The pt hasn't been vaccinated. I told the pt I will make this appointment a televisit and that a nurse will be in touch with her. Please advise

## 2020-12-05 ENCOUNTER — Ambulatory Visit (INDEPENDENT_AMBULATORY_CARE_PROVIDER_SITE_OTHER): Payer: BC Managed Care – PPO | Admitting: Certified Nurse Midwife

## 2020-12-05 ENCOUNTER — Other Ambulatory Visit: Payer: Self-pay

## 2020-12-05 ENCOUNTER — Encounter: Payer: Self-pay | Admitting: Certified Nurse Midwife

## 2020-12-05 DIAGNOSIS — Z3A34 34 weeks gestation of pregnancy: Secondary | ICD-10-CM

## 2020-12-05 DIAGNOSIS — Z3483 Encounter for supervision of other normal pregnancy, third trimester: Secondary | ICD-10-CM

## 2020-12-05 NOTE — Progress Notes (Signed)
Received a transferred call from [place] for a televisit. DOB as identifier. Patient's son was tested for COVID- he is negative but the patient when she called yesterday to change the type of visit she wasn't sure if the results would be back in time. States she has good fetal movement. States she does not have access to a BP cuff or a scale. Denies spotting or cramping. Call transferred to Doreene Burke CNM for completion of visit.

## 2020-12-05 NOTE — Patient Instructions (Signed)
Fetal Positions  In the final weeks of your pregnancy, your baby usually moves into a head-down (vertex) position to get ready for birth. As a normal delivery proceeds through the stages of labor, the baby tucks in the chin and turns to face your back. In this position, the back of your baby's head starts to show (crown) first through your open cervix. Sometimes your baby may be in a different, abnormal position just before birth. These positions are called malpositions or malpresentations. Giving birth can be more difficult if your baby is in an abnormal position. Abnormal fetal positions There are five main abnormal fetal positions:  Occiput posterior presentation. This is the most common abnormal fetal position. It is sometimes called the sunny-side up position because your baby's face points toward your front instead of your back.  Breech presentation. This is also common. In this position, your baby's bottom or feet are in position to come out first.  Face or brow presentation. In this position, your baby is head down, but the face or the front of the head crowns first.  Compound presentation. In this position, your baby's hand or leg comes out along with the head or bottom.  Transverse presentation. In this position, your baby is lying sideways across your birth canal. Your baby's shoulder may come out first. Your health care provider can diagnose an abnormal fetal position during a physical exam as your due date approaches. An abnormal fetal position may be found by feeling your belly and by doing an internal (pelvic) exam. A sound wave imaging study (fetal ultrasound) can be done to confirm the abnormal position. What causes an abnormal fetal position? In many cases, the cause for an abnormal fetal position is not known. You may be at higher risk of having a baby in an abnormal fetal position if:  You have an abnormally shaped womb (uterus) or pelvis.  You have growths in your uterus,  such as fibroids.  Your placenta is large or in an abnormal position.  You are having twins or multiples.  You have too much or too little amniotic fluid.  Your baby has some type of developmental abnormality.  You go into early (premature) labor. How does this affect me? In some cases, your baby may abruptly move into a vertex position just before or during labor. However, an abnormal fetal position increases your risk for a long labor or the need for steps to be taken to help ensure a safe delivery. Your health care provider may need to:  Turn the baby manually by pushing on your belly (external cephalic version).  Use instruments, such as forceps or a suctioning device, to help get your baby through the birth canal (assisted delivery).  Deliver your baby by cesarean delivery, also called a C-section. The exact effects on your delivery will depend on the position your baby is in right before birth. If your baby has an occiput posterior presentation:  You may have to push longer and may have a longer labor.  You may have more back pain.  You may deliver vaginally, but you may also need an assisted delivery using forceps or vacuum device.  You may need a cesarean delivery. If your baby is breech:  Your health care provider may try a vaginal delivery. However, in most cases your health care provider will recommend a cesarean delivery to safely deliver your baby. If your baby is in a face or brow presentation:  Your labor may be longer.  You   may be able to have a vaginal or assisted vaginal delivery.  There is a higher-than-normal risk that you will need a cesarean delivery. If your baby is in a compound presentation:  Your health care provider may be able to change your baby's position manually.  In most cases, this position requires a cesarean delivery. If your baby is in a transverse presentation:  Your health care provider may be able to turn your baby manually.  In  most cases, this position requires a cesarean delivery.   How does this affect my baby? Most babies are not affected by an abnormal fetal position, but there is a higher risk of some complications, including:  Swelling and bruising.  Birth injuries.  Not getting enough oxygen during birth. Summary  The normal fetal position for birth is head down and facing toward your back (vertex position).  Abnormal fetal positions include occiput posterior, breech, face or brow presentation, compound presentation, and transverse position.  In some cases, your baby may abruptly move into a normal position before birth, or your health care provider may be able to change your baby's position manually.  If your baby is in an abnormal fetal position at the time of birth, you have a greater risk of a longer labor, assisted delivery, or a cesarean delivery. This information is not intended to replace advice given to you by your health care provider. Make sure you discuss any questions you have with your health care provider. Document Revised: 08/13/2020 Document Reviewed: 08/13/2020 Elsevier Patient Education  2021 [employer]  

## 2020-12-05 NOTE — Progress Notes (Signed)
Virtual Visit via Telephone Note  I connected with Vanessa Mullins on 12/05/20 at 10:45 AM EST by telephone and verified that I am speaking with the correct person using two identifiers.  Location: Patient: at home Provider: at office    I discussed the limitations, risks, security and privacy concerns of performing an evaluation and management service by telephone and the availability of in person appointments. I also discussed with the patient that there may be a patient responsible charge related to this service. The patient expressed understanding and agreed to proceed.   History of Present Illness: 25 yr old G 2P1001 34wks 2 days tele visit today because her son has been ill, Son was tested for COVID , resettled today , was negative    Observations/Objective: Doing well. Feels good movement. Denies any concern or question. PT state she had [facility] yesterday at [facility] " in [place] and was told baby is not head down.   Assessment and Plan: Reviewed GBS testing and culture at 36 wks. Discussed spinning babies web site. Pt encouraged to complete exercised over then next 2 wk and that we would re evaluate at next appointment. She verbalizes and agrees.   Follow Up Instructions: 2 wks or sooner if needed.    I discussed the assessment and treatment plan with the patient. The patient was provided an opportunity to ask questions and all were answered. The patient agreed with the plan and demonstrated an understanding of the instructions.   The patient was advised to call back or seek an in-person evaluation if the symptoms worsen or if the condition fails to improve as anticipated.  I provided 7 minutes of non-face-to-face time during this encounter.   Doreene Burke, CNM

## 2020-12-21 ENCOUNTER — Encounter: Payer: Self-pay | Admitting: Certified Nurse Midwife

## 2020-12-21 ENCOUNTER — Telehealth: Payer: Self-pay

## 2020-12-21 ENCOUNTER — Other Ambulatory Visit: Payer: Self-pay

## 2020-12-21 ENCOUNTER — Ambulatory Visit (INDEPENDENT_AMBULATORY_CARE_PROVIDER_SITE_OTHER): Payer: BC Managed Care – PPO | Admitting: Certified Nurse Midwife

## 2020-12-21 ENCOUNTER — Observation Stay
Admission: EM | Admit: 2020-12-21 | Discharge: 2020-12-21 | Disposition: A | Discharge status: Home / Self Care | Payer: BC Managed Care – PPO | Attending: Certified Nurse Midwife | Admitting: Certified Nurse Midwife

## 2020-12-21 DIAGNOSIS — O34219 Maternal care for unspecified type scar from previous cesarean delivery: Secondary | ICD-10-CM

## 2020-12-21 DIAGNOSIS — U071 COVID-19: Secondary | ICD-10-CM

## 2020-12-21 DIAGNOSIS — O09899 Supervision of other high risk pregnancies, unspecified trimester: Secondary | ICD-10-CM

## 2020-12-21 DIAGNOSIS — O26893 Other specified pregnancy related conditions, third trimester: Secondary | ICD-10-CM | POA: Diagnosis not present

## 2020-12-21 DIAGNOSIS — Z3A36 36 weeks gestation of pregnancy: Secondary | ICD-10-CM | POA: Diagnosis not present

## 2020-12-21 DIAGNOSIS — O98513 Other viral diseases complicating pregnancy, third trimester: Secondary | ICD-10-CM

## 2020-12-21 DIAGNOSIS — O0993 Supervision of high risk pregnancy, unspecified, third trimester: Secondary | ICD-10-CM

## 2020-12-21 DIAGNOSIS — O26899 Other specified pregnancy related conditions, unspecified trimester: Secondary | ICD-10-CM | POA: Diagnosis present

## 2020-12-21 DIAGNOSIS — Z283 Underimmunization status: Secondary | ICD-10-CM

## 2020-12-21 DIAGNOSIS — O321XX0 Maternal care for breech presentation, not applicable or unspecified: Secondary | ICD-10-CM | POA: Diagnosis not present

## 2020-12-21 DIAGNOSIS — R12 Heartburn: Secondary | ICD-10-CM

## 2020-12-21 HISTORY — DX: COVID-19: U07.1

## 2020-12-21 LAB — CHLAMYDIA/NGC RT PCR (ARMC ONLY)
Chlamydia Tr: NOT DETECTED
N gonorrhoeae: NOT DETECTED

## 2020-12-21 LAB — RUPTURE OF MEMBRANE (ROM)PLUS: Rom Plus: NEGATIVE

## 2020-12-21 LAB — GROUP B STREP BY PCR: Group B strep by PCR: NEGATIVE

## 2020-12-21 MED ORDER — ACETAMINOPHEN 500 MG PO TABS
1000.0000 mg | ORAL_TABLET | Freq: Four times a day (QID) | ORAL | 0 refills | Status: DC | PRN
Start: 2020-12-21 — End: 2022-12-03

## 2020-12-21 MED ORDER — LACTATED RINGERS IV BOLUS
1000.0000 mL | Freq: Once | INTRAVENOUS | Status: AC
  Administered 2020-12-21: 1000 mL via INTRAVENOUS

## 2020-12-21 MED ORDER — ACETAMINOPHEN 500 MG PO TABS
ORAL_TABLET | ORAL | Status: AC
  Administered 2020-12-21: 1000 mg via ORAL
  Filled 2020-12-21: qty 2

## 2020-12-21 MED ORDER — ACETAMINOPHEN 500 MG PO TABS: 1000.0000 mg | ORAL_TABLET | Freq: Once | ORAL | Status: AC

## 2020-12-21 NOTE — Discharge Summary (Signed)
Obstetric Discharge Summary  Patient ID: Vanessa Mullins MRN: [id number] DOB/AGE: [birth year] 26 y.o.   Date of Admission: 12/21/2020  Date of Discharge:  12/21/20  Admitting Diagnosis: Observation at [redacted]w[redacted]d  Secondary Diagnosis:   Patient Active Problem List   Diagnosis Date Noted  . COVID-19 12/21/2020  . Abdominal pain affecting pregnancy 12/21/2020  . Breech presentation of fetus 12/21/2020  . Type B blood, Rh negative 06/15/2020  . Previous cesarean section 06/15/2020  . Patient desires vaginal birth after cesarean section (VBAC) 06/15/2020  . Maternal varicella, non-immune 06/15/2020  . Obesity in pregnancy 06/15/2020       Discharge Diagnosis: No other diagnosis   Antepartum Procedures: NST, IV hydration, and Tylenol; see [facility]   Brief Hospital Course   L&D OB Triage Note  Vanessa Mullins is a 25 y.o. G2P1001 female at [redacted]w[redacted]d, EDD Estimated Date of Delivery: 01/14/21 who presented to triage for complaints of leakage of fluid and lower abdominal pain.  She was evaluated by the nurses with no significant findings for fetal/maternal distress, spontaneous rupture of membranes and premature labor. Vital signs stable, see below. An NST was performed and has been reviewed by CNM. She was treated with Tylenol and IV hydration. Bedside ultrasound revealed fetus in footling breech position.   Temp:  [98.4 F (36.9 C)-98.8 F (37.1 C)] 98.4 F (36.9 C) (02/11 1910) Pulse Rate:  [113-133] 113 (02/11 2050) Resp:  [18-20] 18 (02/11 2000) BP: (122-129)/(54-64) 122/64 (02/11 2000) SpO2:  [97 %-99 %] 97 % (02/11 2050) Weight:  [106.1 kg] 106.1 kg (02/11 1805)  NST INTERPRETATION: Indications: rule out uterine contractions  Mode: External Baseline Rate (A): 150 bpm (FHT) Variability: Moderate Accelerations: 15 x 15 Decelerations: None Contraction Frequency (min): UI  Impression: reactive  Dilation: Closed Presentation: Undeterminable Exam by:: Huston Foley RN  Plan:  NST performed was reviewed and was found to be reactive. She was discharged home with bleeding/labor precautions.  Continue routine prenatal care. Follow up with CNM as previously scheduled.    Discharge Instructions: Per After Visit Summary.  Activity: Advance as tolerated. Pelvic rest for 6 weeks.  Also refer to After Visit Summary  Diet: Regular  Medications: Allergies as of 12/21/2020      Reactions   Amoxicillin Anaphylaxis, Rash   Garlic Anaphylaxis   Other Other (See Comments)   Numbness in fingers      Medication List    TAKE these medications   acetaminophen 500 MG tablet Commonly known as: TYLENOL Take 2 tablets (1,000 mg total) by mouth every 6 (six) hours as needed. What changed: how much to take   multivitamin-prenatal 27-0.8 MG Tabs tablet Take 1 tablet by mouth daily at 12 noon.      Outpatient follow up:   Follow-up Information    [facility] Follow up.   Why: Someone from the office will call you to reschedule your appointment next week Contact information: [address]  [address] [place] [address] [phone]             Postpartum contraception: Unsure  Discharged Condition: stable  Discharged to: home   Serafina Royals, CNM  [facility], [facility] 12/21/20 9:12 PM

## 2020-12-21 NOTE — Patient Instructions (Signed)
Vaginal Birth After Cesarean Delivery  Vaginal birth after cesarean delivery (VBAC) is giving birth vaginally after previously delivering a baby through a cesarean section (C-section). A VBAC may be a safe option for you, depending on your health and other factors. It is important to discuss VBAC with your health care provider early in your pregnancy so you can understand the risks, benefits, and options. Having these discussions early will give you time to make your birth plan. Who are the best candidates for VBAC? The best candidates for VBAC are women who:  Have had one or two prior cesarean deliveries, and the incision made during the delivery was horizontal (low transverse).  Do not have a vertical (classical) scar on their uterus.  Have not had a tear in the wall of their uterus (uterine rupture).  Plan to have more pregnancies. A VBAC is also more likely to be successful:  In women who have previously given birth vaginally.  When labor starts by itself (spontaneously) before the due date. What are the benefits of VBAC? The benefits of delivering your baby vaginally instead of by a cesarean delivery include:  A shorter hospital stay.  A faster recovery time.  Less pain.  Avoiding risks associated with major surgery, such as infection and blood clots.  Less blood loss and less need for donated blood (transfusions). What are the risks of VBAC? The main risk of attempting a VBAC is that it may fail, forcing your health care provider to deliver your baby by a C-section. Other risks are rare and include:  Tearing (rupture) of the scar from a past cesarean delivery.  Other risks associated with vaginal deliveries. If a repeat cesarean delivery is needed, the risks include:  Blood loss.  Infection.  Blood clot.  Damage to surrounding organs.  Removal of the uterus (hysterectomy), if it is damaged.  Placenta problems in future pregnancies. What else should I know  about my options? Delivering a baby through a VBAC is similar to having a normal spontaneous vaginal delivery. Therefore, it is safe:  To try with twins.  For your health care provider to try to turn the baby from a breech position (external cephalic version) during labor.  With epidural analgesia for pain relief. Consider where you would like to deliver your baby. VBAC should be attempted in facilities where an emergency cesarean delivery can be performed. VBAC is not recommended for home births. Any changes in your health or your baby's health during your pregnancy may make it necessary to change your initial decision about VBAC. Your health care provider may recommend that you do not attempt a VBAC if:  Your baby's suspected weight is 8.8 lb (4 kg) or more.  You have preeclampsia. This is a condition that causes high blood pressure along with other symptoms, such as swelling and headaches.  You will have VBAC less than 19 months after your cesarean delivery.  You are past your due date.  You need to have labor started (induced) because your cervix is not ready for labor (unfavorable). Where to find more information  American Pregnancy Association: americanpregnancy.org  American Congress of Obstetricians and Gynecologists: acog.org Summary  Vaginal birth after cesarean delivery (VBAC) is giving birth vaginally after previously delivering a baby through a cesarean section (C-section). A VBAC may be a safe option for you, depending on your health and other factors.  Discuss VBAC with your health care provider early in your pregnancy so you can understand the risks, benefits, options, and   have plenty of time to make your birth plan.  The main risk of attempting a VBAC is that it may fail, forcing your health care provider to deliver your baby by a C-section. Other risks are rare. This information is not intended to replace advice given to you by your health care provider. Make sure  you discuss any questions you have with your health care provider. Document Revised: 02/22/2019 Document Reviewed: 02/03/2017 Elsevier Patient Education  2021 Elsevier Inc.  

## 2020-12-21 NOTE — Discharge Instructions (Signed)
Fetal Movement Counts Patient Name: ________________________________________________ Patient Due Date: ____________________  What is a fetal movement count? A fetal movement count is the number of times that you feel your baby move during a certain amount of time. This may also be called a fetal kick count. A fetal movement count is recommended for every pregnant woman. You may be asked to start counting fetal movements as early as week 28 of your pregnancy. Pay attention to when your baby is most active. You may notice your baby's sleep and wake cycles. You may also notice things that make your baby move more. You should do a fetal movement count:  When your baby is normally most active.  At the same time each day. A good time to count movements is while you are resting, after having something to eat and drink. How do I count fetal movements? 1. Find a quiet, comfortable area. Sit, or lie down on your side. 2. Write down the date, the start time and stop time, and the number of movements that you felt between those two times. Take this information with you to your health care visits. 3. Write down your start time when you feel the first movement. 4. Count kicks, flutters, swishes, rolls, and jabs. You should feel at least 10 movements. 5. You may stop counting after you have felt 10 movements, or if you have been counting for 2 hours. Write down the stop time. 6. If you do not feel 10 movements in 2 hours, contact your health care provider for further instructions. Your health care provider may want to do additional tests to assess your baby's well-being. Contact a health care provider if:  You feel fewer than 10 movements in 2 hours.  Your baby is not moving like he or she usually does. Date: ____________ Start time: ____________ Stop time: ____________ Movements: ____________ Date: ____________ Start time: ____________ Stop time: ____________ Movements: ____________ Date: ____________  Start time: ____________ Stop time: ____________ Movements: ____________ Date: ____________ Start time: ____________ Stop time: ____________ Movements: ____________ Date: ____________ Start time: ____________ Stop time: ____________ Movements: ____________ Date: ____________ Start time: ____________ Stop time: ____________ Movements: ____________ Date: ____________ Start time: ____________ Stop time: ____________ Movements: ____________ Date: ____________ Start time: ____________ Stop time: ____________ Movements: ____________ Date: ____________ Start time: ____________ Stop time: ____________ Movements: ____________ This information is not intended to replace advice given to you by your health care provider. Make sure you discuss any questions you have with your health care provider. Document Revised: 06/16/2019 Document Reviewed: 06/16/2019 Elsevier Patient Education  2021 Elsevier Inc.    COVID-19: What to Do if You Are Sick If you have a fever, cough or other symptoms, you might have COVID-19. Most people have mild illness and are able to recover at home. If you are sick:  Keep track of your symptoms.  If you have an emergency warning sign (including trouble breathing), call 911. Steps to help prevent the spread of COVID-19 if you are sick If you are sick with COVID-19 or think you might have COVID-19, follow the steps below to care for yourself and to help protect other people in your home and community. Stay home except to get medical care  Stay home. Most people with COVID-19 have mild illness and can recover at home without medical care. Do not leave your home, except to get medical care. Do not visit public areas.  Take care of yourself. Get rest and stay hydrated. Take over-the-counter medicines, such as acetaminophen,  to help you feel better.  Stay in touch with your doctor. Call before you get medical care. Be sure to get care if you have trouble breathing, or have any other  emergency warning signs, or if you think it is an emergency.  Avoid public transportation, ride-sharing, or taxis. Separate yourself from other people As much as possible, stay in a specific room and away from other people and pets in your home. If possible, you should use a separate bathroom. If you need to be around other people or animals in or outside of the home, wear a mask. Tell your close contactsthat they may have been exposed to COVID-19. An infected person can spread COVID-19 starting 48 hours (or 2 days) before the person has any symptoms or tests positive. By letting your close contacts know they may have been exposed to COVID-19, you are helping to protect everyone.  Additional guidance is available for those living in close quarters and shared housing.  See COVID-19 and Animals if you have questions about pets.  If you are diagnosed with COVID-19, someone from the health department may call you. Answer the call to slow the spread. Monitor your symptoms  Symptoms of COVID-19 include fever, cough, or other symptoms.  Follow care instructions from your healthcare provider and local health department. Your local health authorities may give instructions on checking your symptoms and reporting information. When to seek emergency medical attention Look for emergency warning signs* for COVID-19. If someone is showing any of these signs, seek emergency medical care immediately:  Trouble breathing  Persistent pain or pressure in the chest  New confusion  Inability to wake or stay awake  Pale, gray, or blue-colored skin, lips, or nail beds, depending on skin tone *This list is not all possible symptoms. Please call your medical provider for any other symptoms that are severe or concerning to you. Call 911 or call ahead to your local emergency facility: Notify the operator that you are seeking care for someone who has or may have COVID-19. Call ahead before visiting your  doctor  Call ahead. Many medical visits for routine care are being postponed or done by phone or telemedicine.  If you have a medical appointment that cannot be postponed, call your doctor's office, and tell them you have or may have COVID-19. This will help the office protect themselves and other patients. Get  tested  If you have symptoms of COVID-19, get tested. While waiting for test results, you stay away from others, including staying apart from those living in your household.  You can visit your state, tribal, local, and territorialhealth department's website to look for the latest local information on testing sites. If you are sick, wear a mask over your nose and mouth  You should wear a mask over your nose and mouth if you must be around other people or animals, including pets (even at home).  You don't need to wear the mask if you are alone. If you can't put on a mask (because of trouble breathing, for example), cover your coughs and sneezes in some other way. Try to stay at least 6 feet away from other people. This will help protect the people around you.  Masks should not be placed on young children under age 93 years, anyone who has trouble breathing, or anyone who is not able to remove the mask without help. Note: During the COVID-19 pandemic, medical grade facemasks are reserved for healthcare workers and some first responders. Cover your  coughs and sneezes  Cover your mouth and nose with a tissue when you cough or sneeze.  Throw away used tissues in a lined trash can.  Immediately wash your hands with soap and water for at least 20 seconds. If soap and water are not available, clean your hands with an alcohol-based hand sanitizer that contains at least 60% alcohol. Clean your hands often  Wash your hands often with soap and water for at least 20 seconds. This is especially important after blowing your nose, coughing, or sneezing; going to the bathroom; and before eating or  preparing food.  Use hand sanitizer if soap and water are not available. Use an alcohol-based hand sanitizer with at least 60% alcohol, covering all surfaces of your hands and rubbing them together until they feel dry.  Soap and water are the best option, especially if hands are visibly dirty.  Avoid touching your eyes, nose, and mouth with unwashed hands.  Handwashing Tips Avoid sharing personal household items  Do not share dishes, drinking glasses, cups, eating utensils, towels, or bedding with other people in your home.  Wash these items thoroughly after using them with soap and water or put in the dishwasher. Clean all "high-touch" surfaces everyday  Clean and disinfect high-touch surfaces in your "sick room" and bathroom; wear disposable gloves. Let someone else clean and disinfect surfaces in common areas, but you should clean your bedroom and bathroom, if possible.  If a caregiver or other person needs to clean and disinfect a sick person's bedroom or bathroom, they should do so on an as-needed basis. The caregiver/other person should wear a mask and disposable gloves prior to cleaning. They should wait as long as possible after the person who is sick has used the bathroom before coming in to clean and use the bathroom. ? High-touch surfaces include phones, remote controls, counters, tabletops, doorknobs, bathroom fixtures, toilets, keyboards, tablets, and bedside tables.  Clean and disinfect areas that may have blood, stool, or body fluids on them.  Use household cleaners and disinfectants. Clean the area or item with soap and water or another detergent if it is dirty. Then, use a household disinfectant. ? Be sure to follow the instructions on the label to ensure safe and effective use of the product. Many products recommend keeping the surface wet for several minutes to ensure germs are killed. Many also recommend precautions such as wearing gloves and making sure you have good  ventilation during use of the product. ? Use a product from [employer] List N: Disinfectants for Coronavirus (COVID-19). ? Complete Disinfection Guidance When you can be around others after being sick with COVID-19 Deciding when you can be around others is different for different situations. Find out when you can safely end home isolation. For any additional questions about your care, contact your healthcare provider or state or local health department. 01/25/2020 Content source: [facility] ([facility]), Division of Viral Diseases This information is not intended to replace advice given to you by your health care provider. Make sure you discuss any questions you have with your health care provider. Document Revised: 09/10/2020 Document Reviewed: 09/10/2020 Elsevier Patient Education  2021 Elsevier Inc.      10 Things You Can Do to Manage Your COVID-19 Symptoms at Home If you have possible or confirmed COVID-19: 1. Stay home except to get medical care. 2. Monitor your symptoms carefully. If your symptoms get worse, call your healthcare provider immediately. 3. Get rest and stay  hydrated. 4. If you have a medical appointment, call the healthcare provider ahead of time and tell them that you have or may have COVID-19. 5. For medical emergencies, call 911 and notify the dispatch personnel that you have or may have COVID-19. 6. Cover your cough and sneezes with a tissue or use the inside of your elbow. 7. Wash your hands often with soap and water for at least 20 seconds or clean your hands with an alcohol-based hand sanitizer that contains at least 60% alcohol. 8. As much as possible, stay in a specific room and away from other people in your home. Also, you should use a separate bathroom, if available. If you need to be around other people in or outside of the home, wear a mask. 9. Avoid sharing personal items with other people in your household, like  dishes, towels, and bedding. 10. Clean all surfaces that are touched often, like counters, tabletops, and doorknobs. Use household cleaning sprays or wipes according to the label instructions. [employer] 05/25/2020 This information is not intended to replace advice given to you by your health care provider. Make sure you discuss any questions you have with your health care provider. Document Revised: 09/10/2020 Document Reviewed: 09/10/2020 Elsevier Patient Education  2021 [employer].

## 2020-12-21 NOTE — Telephone Encounter (Signed)
Pt called in and stated that she is having Covid symptoms, like sore throat. The pt said that she is going to gt tested today. I told the pt I will send a message to [place] ad turn her visit today into a televisit.

## 2020-12-21 NOTE — Progress Notes (Signed)
She was experiencing some pain/pressure/cramping in her lower abdomen while she was sleeping, pain every hour waking her out of her sleep. Pain is much better today. She has some swelling in her lower legs, ankles and feet, indention last about 1 minute. Nipples are super sore. She has been leaking clear water today when she was getting in to tub. She felt pressure and the clear water, but only a small amount. Her husband tested positive for covid. Her rapid was negative. No results available from PCR yet. She has been running a fever up to 100.5 today. She has body aches, cough congestion and headaches.

## 2020-12-24 NOTE — Progress Notes (Signed)
Virtual Visit via Telephone Note  I connected with Vanessa Mullins on 12/24/20 at  2:45 PM EST by telephone and verified that I am speaking with the correct person using two identifiers.  Location:  Patient: Vanessa Mullins (home)  Provider: Serafina Royals, CNM ([facility], [facility])   I discussed the limitations, risks, security and privacy concerns of performing an evaluation and management service by telephone and the availability of in person appointments. I also discussed with the patient that there may be a patient responsible charge related to this service. The patient expressed understanding and agreed to proceed.   History of Present Illness:  Patient called for prenatal TELEVISIT due to positive COVID-19.   Reports mild COVID symptoms, increased pelvic pressure, and copious amounts of vaginal discharge.   Denies difficulty breathing or respiratory distress, chest pain, abdominal pain, vaginal bleeding, dysuria, and leg pain.    Observations/Objective:  Reports positive fetal movement.   Assessment:  Supervision high risk pregnancy in the third trimester  [redacted] weeks gestation  COVID-19  Heartburn in pregnancy  Plan:  Discussed home treatment measures for COVID symptoms.   Anticipatory guidance regarding course of prenatal care.   Reviewed red flag symptoms and when to call.   RTC x 1 week for ROB or sooner if needed.  Follow Up Instructions: See AVS    I discussed the assessment and treatment plan with the patient. The patient was provided an opportunity to ask questions and all were answered. The patient agreed with the plan and demonstrated an understanding of the instructions.   The patient was advised to call back or seek an in-person evaluation if the symptoms worsen or if the condition fails to improve as anticipated.  I provided 8 minutes of non-face-to-face time during this encounter.   Serafina Royals, CNM  [facility], [facility]

## 2020-12-28 ENCOUNTER — Encounter: Payer: Self-pay | Admitting: Certified Nurse Midwife

## 2020-12-28 ENCOUNTER — Other Ambulatory Visit: Payer: Self-pay

## 2020-12-28 ENCOUNTER — Ambulatory Visit (INDEPENDENT_AMBULATORY_CARE_PROVIDER_SITE_OTHER): Payer: BC Managed Care – PPO | Admitting: Certified Nurse Midwife

## 2020-12-28 VITALS — BP 114/87 | HR 83 | Wt 244.0 lb

## 2020-12-28 DIAGNOSIS — Z3A37 37 weeks gestation of pregnancy: Secondary | ICD-10-CM

## 2020-12-28 DIAGNOSIS — O322XX0 Maternal care for transverse and oblique lie, not applicable or unspecified: Secondary | ICD-10-CM

## 2020-12-28 DIAGNOSIS — O0993 Supervision of high risk pregnancy, unspecified, third trimester: Secondary | ICD-10-CM

## 2020-12-28 LAB — POCT URINALYSIS DIPSTICK OB
Bilirubin, UA: NEGATIVE
Blood, UA: NEGATIVE
Glucose, UA: NEGATIVE
Ketones, UA: NEGATIVE
Leukocytes, UA: NEGATIVE
Nitrite, UA: NEGATIVE
POC,PROTEIN,UA: NEGATIVE
Spec Grav, UA: 1.01 (ref 1.010–1.025)
Urobilinogen, UA: 0.2 E.U./dL
pH, UA: 6.5 (ref 5.0–8.0)

## 2020-12-28 NOTE — Progress Notes (Signed)
ROB-Pt present for routine prenatal care. Pt stated having lower abd pressure and contractions.

## 2020-12-28 NOTE — Patient Instructions (Addendum)
Fetal Movement Counts Patient Name: ________________________________________________ Patient Due Date: ____________________  What is a fetal movement count? A fetal movement count is the number of times that you feel your baby move during a certain amount of time. This may also be called a fetal kick count. A fetal movement count is recommended for every pregnant woman. You may be asked to start counting fetal movements as early as week 28 of your pregnancy. Pay attention to when your baby is most active. You may notice your baby's sleep and wake cycles. You may also notice things that make your baby move more. You should do a fetal movement count:  When your baby is normally most active.  At the same time each day. A good time to count movements is while you are resting, after having something to eat and drink. How do I count fetal movements? 1. Find a quiet, comfortable area. Sit, or lie down on your side. 2. Write down the date, the start time and stop time, and the number of movements that you felt between those two times. Take this information with you to your health care visits. 3. Write down your start time when you feel the first movement. 4. Count kicks, flutters, swishes, rolls, and jabs. You should feel at least 10 movements. 5. You may stop counting after you have felt 10 movements, or if you have been counting for 2 hours. Write down the stop time. 6. If you do not feel 10 movements in 2 hours, contact your health care provider for further instructions. Your health care provider may want to do additional tests to assess your baby's well-being. Contact a health care provider if:  You feel fewer than 10 movements in 2 hours.  Your baby is not moving like he or she usually does. Date: ____________ Start time: ____________ Stop time: ____________ Movements: ____________ Date: ____________ Start time: ____________ Stop time: ____________ Movements: ____________ Date: ____________  Start time: ____________ Stop time: ____________ Movements: ____________ Date: ____________ Start time: ____________ Stop time: ____________ Movements: ____________ Date: ____________ Start time: ____________ Stop time: ____________ Movements: ____________ Date: ____________ Start time: ____________ Stop time: ____________ Movements: ____________ Date: ____________ Start time: ____________ Stop time: ____________ Movements: ____________ Date: ____________ Start time: ____________ Stop time: ____________ Movements: ____________ Date: ____________ Start time: ____________ Stop time: ____________ Movements: ____________ This information is not intended to replace advice given to you by your health care provider. Make sure you discuss any questions you have with your health care provider. Document Revised: 06/16/2019 Document Reviewed: 06/16/2019 Elsevier Patient Education  2021 Else[employer] Inc.  

## 2020-12-29 NOTE — Progress Notes (Signed)
ROB-Doing well, still recovering from COVID. Reports lower abdominal pain and occasional contractions. Discussed home treatment measures. Fetus transverse with head to maternal right on bedside ultrasound. Anticipatory guidance regarding course of prenatal care. Reviewed red flag symptoms and when to call. RTC x 1 week for fetal position ultrasound and ROB with MD (repeat c-section maybe indicated due to fetal malpresentation).

## 2021-01-02 ENCOUNTER — Ambulatory Visit (INDEPENDENT_AMBULATORY_CARE_PROVIDER_SITE_OTHER): Payer: BC Managed Care – PPO | Admitting: Obstetrics and Gynecology

## 2021-01-02 ENCOUNTER — Ambulatory Visit (INDEPENDENT_AMBULATORY_CARE_PROVIDER_SITE_OTHER): Payer: BC Managed Care – PPO

## 2021-01-02 ENCOUNTER — Other Ambulatory Visit: Payer: Self-pay

## 2021-01-02 ENCOUNTER — Encounter: Payer: Self-pay | Admitting: Obstetrics and Gynecology

## 2021-01-02 VITALS — BP 135/78 | HR 91 | Wt 245.6 lb

## 2021-01-02 DIAGNOSIS — O321XX0 Maternal care for breech presentation, not applicable or unspecified: Secondary | ICD-10-CM

## 2021-01-02 DIAGNOSIS — O0993 Supervision of high risk pregnancy, unspecified, third trimester: Secondary | ICD-10-CM

## 2021-01-02 DIAGNOSIS — Z3A37 37 weeks gestation of pregnancy: Secondary | ICD-10-CM

## 2021-01-02 DIAGNOSIS — O34219 Maternal care for unspecified type scar from previous cesarean delivery: Secondary | ICD-10-CM

## 2021-01-02 DIAGNOSIS — O322XX0 Maternal care for transverse and oblique lie, not applicable or unspecified: Secondary | ICD-10-CM

## 2021-01-02 DIAGNOSIS — Z283 Underimmunization status: Secondary | ICD-10-CM

## 2021-01-02 DIAGNOSIS — O09899 Supervision of other high risk pregnancies, unspecified trimester: Secondary | ICD-10-CM

## 2021-01-02 DIAGNOSIS — Z3A38 38 weeks gestation of pregnancy: Secondary | ICD-10-CM

## 2021-01-02 LAB — POCT URINALYSIS DIPSTICK OB
Bilirubin, UA: NEGATIVE
Blood, UA: NEGATIVE
Glucose, UA: NEGATIVE
Ketones, UA: NEGATIVE
Leukocytes, UA: NEGATIVE
Nitrite, UA: NEGATIVE
Odor: NEGATIVE
POC,PROTEIN,UA: NEGATIVE
Spec Grav, UA: <=1.005 — AB (ref 1.010–1.025)
Urobilinogen, UA: 0.2 E.U./dL
pH, UA: 6.5 (ref 5.0–8.0)

## 2021-01-02 NOTE — Progress Notes (Signed)
ROB: Patient had repeat ultrasound today to confirm transverse position.  She has decided she would like a repeat cesarean delivery.  Her previous cesarean was for transverse position.  She states "this baby is in the exact same place".  I started to talk to her about the possibility of external cephalic version and she states that she has heard it before specifically from Dr. Valentino Saxon and she had thought about it but decided repeat cesarean delivery was her best option.  Surgery discussed.  Scheduled for March 1 at 730.  Patient previously tested positive for Covid and will not need to be tested.  All questions answered.  Patient not interested in skin to skin after birth during surgery.

## 2021-01-02 NOTE — Progress Notes (Signed)
ROB and f/u after u/s- Has questions about covid and pregnancy.

## 2021-01-03 ENCOUNTER — Encounter: Payer: Self-pay | Admitting: Obstetrics and Gynecology

## 2021-01-04 ENCOUNTER — Encounter
Admission: RE | Admit: 2021-01-04 | Discharge: 2021-01-04 | Disposition: A | Discharge status: Home / Self Care | Payer: BC Managed Care – PPO | Source: Ambulatory Visit | Attending: Obstetrics and Gynecology | Admitting: Obstetrics and Gynecology

## 2021-01-04 ENCOUNTER — Other Ambulatory Visit: Payer: Self-pay

## 2021-01-04 ENCOUNTER — Other Ambulatory Visit: Payer: BC Managed Care – PPO

## 2021-01-04 DIAGNOSIS — Z01818 Encounter for other preprocedural examination: Secondary | ICD-10-CM | POA: Insufficient documentation

## 2021-01-04 NOTE — Patient Instructions (Addendum)
Your procedure is scheduled on: 01/08/21  Arrive at 0545 am to the [facility]- Call [phone] and someone will escort you to L & D.    REMEMBER: Instructions that are not followed completely may result in serious medical risk, up to and including death; or upon the discretion of your surgeon and anesthesiologist your surgery may need to be rescheduled.  Do not eat food after midnight the night before surgery.  No gum chewing, lozengers or hard candies.  You may however, drink CLEAR liquids up to 2 hours before you are scheduled to arrive for your surgery. Do not drink anything within 2 hours of your scheduled arrival time.  Clear liquids include: - water  - apple juice without pulp - gatorade (not RED, PURPLE, OR BLUE) - black coffee or tea (Do NOT add milk or creamers to the coffee or tea) Do NOT drink anything that is not on this list.  Type 1 and Type 2 diabetics should only drink water.  TAKE THESE MEDICATIONS THE MORNING OF SURGERY WITH A SIP OF WATER: none  One week prior to surgery: Stop Anti-inflammatories (NSAIDS) such as Advil, Aleve, Ibuprofen, Motrin, Naproxen, Naprosyn and Aspirin based products such as Excedrin, Goodys Powder, BC Powder.  Stop ANY OVER THE COUNTER supplements until after surgery.  No Alcohol for 24 hours before or after surgery.  No Smoking including e-cigarettes for 24 hours prior to surgery.  No chewable tobacco products for at least 6 hours prior to surgery.  No nicotine patches on the day of surgery.  Do not use any "recreational" drugs for at least a week prior to your surgery.  Please be advised that the combination of cocaine and anesthesia may have negative outcomes, up to and including death. If you test positive for cocaine, your surgery will be cancelled.  On the morning of surgery brush your teeth with toothpaste and water, you may rinse your mouth with mouthwash if you wish. Do not swallow any toothpaste or mouthwash.  Do not  wear jewelry, make-up, hairpins, clips or nail polish. Remove any piercing's and leave at home.  Do not wear lotions, powders, or perfumes.   Do not shave body from the neck down 48 hours prior to surgery just in case you cut yourself which could leave a site for infection.  Also, freshly shaved skin may become irritated if using the CHG soap.  Contact lenses, hearing aids and dentures may not be worn into surgery.  Do not bring valuables to the hospital. [facility] is not responsible for any missing/lost belongings or valuables.   Use CHG Soap or wipes as directed on instruction sheet. If you will be breast feeding do not use the wipes on your breast.   Notify your doctor if there is any change in your medical condition (cold, fever, infection).  Wear comfortable clothing (specific to your surgery type) to the hospital.  Plan for stool softeners for home use; pain medications have a tendency to cause constipation. You can also help prevent constipation by eating foods high in fiber such as fruits and vegetables and drinking plenty of fluids as your diet allows.  After surgery, you can help prevent lung complications by doing breathing exercises.  Take deep breaths and cough every 1-2 hours. Your doctor may order a device called an Incentive Spirometer to help you take deep breaths. When coughing or sneezing, hold a pillow firmly against your incision with both hands. This is called "splinting." Doing this helps protect your  incision. It also decreases belly discomfort.  If you are being admitted to the hospital overnight, leave your suitcase in the car. After surgery it may be brought to your room.  If you are being discharged the day of surgery, you will not be allowed to drive home. You will need a responsible adult (18 years or older) to drive you home and stay with you that night.   If you are taking public transportation, you will need to have a responsible adult (18 years or  older) with you. Please confirm with your physician that it is acceptable to use public transportation.   Please call the Pre-admissions Testing Dept. at [phone] if you have any questions about these instructions.  Visitation Policy:  Patients undergoing a surgery or procedure may have one family member or support person with them as long as that person is not COVID-19 positive or experiencing its symptoms.  That person may remain in the waiting area during the procedure.  Inpatient Visitation:    Visiting hours are 7 a.m. to 8 p.m. Patients will be allowed one visitor. The visitor may change daily. The visitor must pass COVID-19 screenings, use hand sanitizer when entering and exiting the patient's room and wear a mask at all times, including in the patient's room. Patients must also wear a mask when staff or their visitor are in the room. Masking is required regardless of vaccination status. Systemwide, no visitors 17 or younger.  Visitation Policy Changes: The following changes are to take effect on Feb. 28 at 7 a.m.  No visitors under the age of 40. Any visitor under the age of 9 must be accompanied by an adult. Adult inpatients: Two visitors will be allowed daily and the visitors may change each day during the patient's stay. (Please note -- no changes at this time for the [facility], [facility] Emergency Department and inpatients, Emergency Departments, Ambulatory Sites, [facility], medical practices and procedural areas

## 2021-01-07 ENCOUNTER — Other Ambulatory Visit: Payer: Self-pay

## 2021-01-07 ENCOUNTER — Other Ambulatory Visit
Admission: RE | Admit: 2021-01-07 | Discharge: 2021-01-07 | Disposition: A | Discharge status: Home / Self Care | Payer: BC Managed Care – PPO | Source: Home / Self Care | Attending: Obstetrics and Gynecology | Admitting: Obstetrics and Gynecology

## 2021-01-07 ENCOUNTER — Encounter
Admission: RE | Admit: 2021-01-07 | Discharge: 2021-01-07 | Disposition: A | Discharge status: Home / Self Care | Payer: BC Managed Care – PPO | Source: Ambulatory Visit | Attending: Obstetrics and Gynecology | Admitting: Obstetrics and Gynecology

## 2021-01-07 DIAGNOSIS — Z01812 Encounter for preprocedural laboratory examination: Secondary | ICD-10-CM | POA: Insufficient documentation

## 2021-01-07 LAB — BASIC METABOLIC PANEL
Anion gap: 8 (ref 5–15)
BUN: 8 mg/dL (ref 6–20)
CO2: 20 mmol/L — ABNORMAL LOW (ref 22–32)
Calcium: 8.6 mg/dL — ABNORMAL LOW (ref 8.9–10.3)
Chloride: 107 mmol/L (ref 98–111)
Creatinine, Ser: 0.49 mg/dL (ref 0.44–1.00)
GFR, Estimated: >60 mL/min (ref >60)
Glucose, Bld: 82 mg/dL (ref 70–99)
Potassium: 4.2 mmol/L (ref 3.5–5.1)
Sodium: 135 mmol/L (ref 135–145)

## 2021-01-07 LAB — CBC
HCT: 34.6 % — ABNORMAL LOW (ref 36.0–46.0)
Hemoglobin: 11.8 g/dL — ABNORMAL LOW (ref 12.0–15.0)
MCH: 26.8 pg (ref 26.0–34.0)
MCHC: 34.1 g/dL (ref 30.0–36.0)
MCV: 78.6 fL — ABNORMAL LOW (ref 80.0–100.0)
Platelets: 238 10*3/uL (ref 150–400)
RBC: 4.4 MIL/uL (ref 3.87–5.11)
RDW: 13.3 % (ref 11.5–15.5)
WBC: 7.7 10*3/uL (ref 4.0–10.5)
nRBC: 0 % (ref 0.0–0.2)

## 2021-01-07 MED ORDER — CHLORHEXIDINE GLUCONATE 0.12 % MT SOLN
15.0000 mL | Freq: Once | OROMUCOSAL | Status: AC
  Administered 2021-01-08: 15 mL via OROMUCOSAL
  Filled 2021-01-07: qty 15

## 2021-01-07 MED ORDER — LACTATED RINGERS IV SOLN: Freq: Once | INTRAVENOUS | Status: AC

## 2021-01-07 MED ORDER — LACTATED RINGERS IV SOLN: INTRAVENOUS | Status: DC

## 2021-01-07 MED ORDER — ORAL CARE MOUTH RINSE: 15.0000 mL | Freq: Once | OROMUCOSAL | Status: AC

## 2021-01-07 MED ORDER — FAMOTIDINE 20 MG PO TABS
20.0000 mg | ORAL_TABLET | Freq: Once | ORAL | Status: AC
  Administered 2021-01-08: 20 mg via ORAL
  Filled 2021-01-07: qty 1

## 2021-01-08 ENCOUNTER — Inpatient Hospital Stay: Payer: BC Managed Care – PPO | Admitting: Certified Registered"

## 2021-01-08 ENCOUNTER — Inpatient Hospital Stay
Admission: RE | Admit: 2021-01-08 | Discharge: 2021-01-10 | DRG: 788 | Disposition: A | Discharge status: Home / Self Care | Payer: BC Managed Care – PPO | Attending: Obstetrics and Gynecology | Admitting: Obstetrics and Gynecology

## 2021-01-08 ENCOUNTER — Encounter
Admission: RE | Disposition: A | Discharge status: Home / Self Care | Payer: Self-pay | Source: Home / Self Care | Attending: Obstetrics and Gynecology

## 2021-01-08 ENCOUNTER — Encounter: Payer: Self-pay | Admitting: Obstetrics and Gynecology

## 2021-01-08 DIAGNOSIS — O99214 Obesity complicating childbirth: Secondary | ICD-10-CM | POA: Diagnosis present

## 2021-01-08 DIAGNOSIS — O3400 Maternal care for unspecified congenital malformation of uterus, unspecified trimester: Secondary | ICD-10-CM

## 2021-01-08 DIAGNOSIS — Z3A39 39 weeks gestation of pregnancy: Secondary | ICD-10-CM | POA: Diagnosis not present

## 2021-01-08 DIAGNOSIS — O09899 Supervision of other high risk pregnancies, unspecified trimester: Secondary | ICD-10-CM

## 2021-01-08 DIAGNOSIS — O3403 Maternal care for unspecified congenital malformation of uterus, third trimester: Secondary | ICD-10-CM | POA: Diagnosis present

## 2021-01-08 DIAGNOSIS — Z8616 Personal history of COVID-19: Secondary | ICD-10-CM | POA: Diagnosis not present

## 2021-01-08 DIAGNOSIS — O34211 Maternal care for low transverse scar from previous cesarean delivery: Secondary | ICD-10-CM

## 2021-01-08 DIAGNOSIS — Z2839 Other underimmunization status: Secondary | ICD-10-CM

## 2021-01-08 DIAGNOSIS — O360130 Maternal care for anti-D [Rh] antibodies, third trimester, not applicable or unspecified: Secondary | ICD-10-CM

## 2021-01-08 DIAGNOSIS — O322XX0 Maternal care for transverse and oblique lie, not applicable or unspecified: Secondary | ICD-10-CM | POA: Diagnosis present

## 2021-01-08 DIAGNOSIS — Q513 Bicornate uterus: Secondary | ICD-10-CM | POA: Diagnosis not present

## 2021-01-08 DIAGNOSIS — E669 Obesity, unspecified: Secondary | ICD-10-CM | POA: Diagnosis present

## 2021-01-08 DIAGNOSIS — O321XX0 Maternal care for breech presentation, not applicable or unspecified: Secondary | ICD-10-CM | POA: Diagnosis present

## 2021-01-08 DIAGNOSIS — O26893 Other specified pregnancy related conditions, third trimester: Secondary | ICD-10-CM | POA: Diagnosis present

## 2021-01-08 DIAGNOSIS — Z3490 Encounter for supervision of normal pregnancy, unspecified, unspecified trimester: Secondary | ICD-10-CM

## 2021-01-08 DIAGNOSIS — Z23 Encounter for immunization: Secondary | ICD-10-CM

## 2021-01-08 DIAGNOSIS — Z98891 History of uterine scar from previous surgery: Secondary | ICD-10-CM

## 2021-01-08 DIAGNOSIS — Z6791 Unspecified blood type, Rh negative: Secondary | ICD-10-CM

## 2021-01-08 DIAGNOSIS — O34219 Maternal care for unspecified type scar from previous cesarean delivery: Secondary | ICD-10-CM

## 2021-01-08 LAB — TYPE AND SCREEN
ABO/RH(D): B NEG
Antibody Screen: POSITIVE
Extend sample reason: UNDETERMINED

## 2021-01-08 LAB — ABO/RH: ABO/RH(D): B NEG

## 2021-01-08 SURGERY — Surgical Case
Anesthesia: Spinal

## 2021-01-08 MED ORDER — IBUPROFEN 800 MG PO TABS
800.0000 mg | ORAL_TABLET | Freq: Three times a day (TID) | ORAL | Status: DC
  Administered 2021-01-09 – 2021-01-10 (×4): 800 mg via ORAL
  Filled 2021-01-08 (×5): qty 1

## 2021-01-08 MED ORDER — DIPHENHYDRAMINE HCL 25 MG PO CAPS: 25.0000 mg | ORAL_CAPSULE | Freq: Four times a day (QID) | ORAL | Status: DC | PRN

## 2021-01-08 MED ORDER — NALBUPHINE HCL 10 MG/ML IJ SOLN
5.0000 mg | INTRAMUSCULAR | Status: DC | PRN
  Administered 2021-01-09: 5 mg via INTRAVENOUS
  Filled 2021-01-08: qty 1

## 2021-01-08 MED ORDER — LIDOCAINE 5 % EX PTCH
MEDICATED_PATCH | CUTANEOUS | Status: AC
  Filled 2021-01-08: qty 1

## 2021-01-08 MED ORDER — OXYTOCIN-SODIUM CHLORIDE 30-0.9 UT/500ML-% IV SOLN
INTRAVENOUS | Status: AC
  Filled 2021-01-08: qty 500

## 2021-01-08 MED ORDER — MENTHOL 3 MG MT LOZG
1.0000 | LOZENGE | OROMUCOSAL | Status: DC | PRN
  Filled 2021-01-08: qty 9

## 2021-01-08 MED ORDER — SENNOSIDES-DOCUSATE SODIUM 8.6-50 MG PO TABS
2.0000 | ORAL_TABLET | ORAL | Status: DC
  Administered 2021-01-08: 2 via ORAL
  Filled 2021-01-08: qty 2

## 2021-01-08 MED ORDER — SOD CITRATE-CITRIC ACID 500-334 MG/5ML PO SOLN
30.0000 mL | Freq: Once | ORAL | Status: AC
  Administered 2021-01-08: 30 mL via ORAL

## 2021-01-08 MED ORDER — ZOLPIDEM TARTRATE 5 MG PO TABS: 5.0000 mg | ORAL_TABLET | Freq: Every evening | ORAL | Status: DC | PRN

## 2021-01-08 MED ORDER — OXYCODONE-ACETAMINOPHEN 5-325 MG PO TABS
1.0000 | ORAL_TABLET | ORAL | Status: DC | PRN
Start: 2021-01-08 — End: 2021-01-08

## 2021-01-08 MED ORDER — SOD CITRATE-CITRIC ACID 500-334 MG/5ML PO SOLN
ORAL | Status: AC
  Filled 2021-01-08: qty 30

## 2021-01-08 MED ORDER — NALBUPHINE HCL 10 MG/ML IJ SOLN: 5.0000 mg | Freq: Once | INTRAMUSCULAR | Status: DC | PRN

## 2021-01-08 MED ORDER — ONDANSETRON HCL 4 MG/2ML IJ SOLN: 4.0000 mg | Freq: Three times a day (TID) | INTRAMUSCULAR | Status: DC | PRN

## 2021-01-08 MED ORDER — NALOXONE HCL 4 MG/10ML IJ SOLN
1.0000 ug/kg/h | INTRAVENOUS | Status: DC | PRN
  Filled 2021-01-08: qty 5

## 2021-01-08 MED ORDER — MORPHINE SULFATE (PF) 0.5 MG/ML IJ SOLN
INTRAMUSCULAR | Status: AC
  Filled 2021-01-08: qty 10

## 2021-01-08 MED ORDER — IBUPROFEN 800 MG PO TABS: 800.0000 mg | ORAL_TABLET | Freq: Three times a day (TID) | ORAL | Status: DC

## 2021-01-08 MED ORDER — SCOPOLAMINE 1 MG/3DAYS TD PT72: 1.0000 | MEDICATED_PATCH | Freq: Once | TRANSDERMAL | Status: DC

## 2021-01-08 MED ORDER — MEPERIDINE HCL 50 MG/ML IJ SOLN: 6.2500 mg | INTRAMUSCULAR | Status: DC | PRN

## 2021-01-08 MED ORDER — OXYTOCIN-SODIUM CHLORIDE 30-0.9 UT/500ML-% IV SOLN
2.5000 [IU]/h | INTRAVENOUS | Status: AC
  Administered 2021-01-08: 2.5 [IU]/h via INTRAVENOUS
  Filled 2021-01-08: qty 500

## 2021-01-08 MED ORDER — SODIUM CHLORIDE 0.9 % IV SOLN
INTRAVENOUS | Status: DC | PRN
  Administered 2021-01-08: 30 ug/min via INTRAVENOUS

## 2021-01-08 MED ORDER — LIDOCAINE 5 % EX PTCH
MEDICATED_PATCH | CUTANEOUS | Status: DC | PRN
  Administered 2021-01-08: 1 via TRANSDERMAL

## 2021-01-08 MED ORDER — NALBUPHINE HCL 10 MG/ML IJ SOLN: 5.0000 mg | INTRAMUSCULAR | Status: DC | PRN

## 2021-01-08 MED ORDER — ACETAMINOPHEN 500 MG PO TABS
1000.0000 mg | ORAL_TABLET | Freq: Four times a day (QID) | ORAL | Status: AC
  Administered 2021-01-08 – 2021-01-09 (×4): 1000 mg via ORAL
  Filled 2021-01-08 (×4): qty 2

## 2021-01-08 MED ORDER — CEFAZOLIN SODIUM-DEXTROSE 2-4 GM/100ML-% IV SOLN
INTRAVENOUS | Status: AC
  Filled 2021-01-08: qty 100

## 2021-01-08 MED ORDER — SODIUM CHLORIDE 0.9% FLUSH: 3.0000 mL | INTRAVENOUS | Status: DC | PRN

## 2021-01-08 MED ORDER — SIMETHICONE 80 MG PO CHEW
80.0000 mg | CHEWABLE_TABLET | Freq: Four times a day (QID) | ORAL | Status: DC
  Administered 2021-01-08 – 2021-01-10 (×8): 80 mg via ORAL
  Filled 2021-01-08 (×8): qty 1

## 2021-01-08 MED ORDER — OXYCODONE HCL 5 MG PO TABS: 5.0000 mg | ORAL_TABLET | ORAL | Status: DC | PRN

## 2021-01-08 MED ORDER — BUPIVACAINE IN DEXTROSE 0.75-8.25 % IT SOLN
INTRATHECAL | Status: DC | PRN
  Administered 2021-01-08: 1.6 mL via INTRATHECAL

## 2021-01-08 MED ORDER — LACTATED RINGERS IV SOLN: INTRAVENOUS | Status: DC

## 2021-01-08 MED ORDER — PRENATAL MULTIVITAMIN CH
1.0000 | ORAL_TABLET | Freq: Every day | ORAL | Status: DC
  Administered 2021-01-08 – 2021-01-10 (×3): 1 via ORAL
  Filled 2021-01-08 (×3): qty 1

## 2021-01-08 MED ORDER — ONDANSETRON HCL 4 MG/2ML IJ SOLN
INTRAMUSCULAR | Status: DC | PRN
  Administered 2021-01-08: 4 mg via INTRAVENOUS

## 2021-01-08 MED ORDER — OXYTOCIN-SODIUM CHLORIDE 30-0.9 UT/500ML-% IV SOLN
INTRAVENOUS | Status: DC | PRN
  Administered 2021-01-08: 30 [IU] via INTRAVENOUS

## 2021-01-08 MED ORDER — ONDANSETRON HCL 4 MG/2ML IJ SOLN
INTRAMUSCULAR | Status: AC
  Filled 2021-01-08: qty 2

## 2021-01-08 MED ORDER — OXYCODONE HCL 5 MG PO TABS: 10.0000 mg | ORAL_TABLET | ORAL | Status: DC | PRN

## 2021-01-08 MED ORDER — POVIDONE-IODINE 10 % EX SWAB: 2.0000 "application " | Freq: Once | CUTANEOUS | Status: DC

## 2021-01-08 MED ORDER — MORPHINE SULFATE (PF) 0.5 MG/ML IJ SOLN
INTRAMUSCULAR | Status: DC | PRN
  Administered 2021-01-08: 100 mg via EPIDURAL

## 2021-01-08 MED ORDER — FENTANYL CITRATE (PF) 100 MCG/2ML IJ SOLN
INTRAMUSCULAR | Status: AC
  Filled 2021-01-08: qty 2

## 2021-01-08 MED ORDER — PHENYLEPHRINE HCL (PRESSORS) 10 MG/ML IV SOLN
INTRAVENOUS | Status: DC | PRN
  Administered 2021-01-08: 200 ug via INTRAVENOUS
  Administered 2021-01-08: 100 ug via INTRAVENOUS

## 2021-01-08 MED ORDER — KETOROLAC TROMETHAMINE 30 MG/ML IJ SOLN: 30.0000 mg | Freq: Four times a day (QID) | INTRAMUSCULAR | Status: AC

## 2021-01-08 MED ORDER — FENTANYL CITRATE (PF) 100 MCG/2ML IJ SOLN
INTRAMUSCULAR | Status: DC | PRN
  Administered 2021-01-08: 15 ug via INTRAVENOUS

## 2021-01-08 MED ORDER — NALOXONE HCL 0.4 MG/ML IJ SOLN: 0.4000 mg | INTRAMUSCULAR | Status: DC | PRN

## 2021-01-08 MED ORDER — KETOROLAC TROMETHAMINE 30 MG/ML IJ SOLN
30.0000 mg | Freq: Four times a day (QID) | INTRAMUSCULAR | Status: AC
  Administered 2021-01-08 – 2021-01-09 (×4): 30 mg via INTRAVENOUS
  Filled 2021-01-08 (×4): qty 1

## 2021-01-08 SURGICAL SUPPLY — 30 items
ADHESIVE MASTISOL STRL (MISCELLANEOUS) ×2 IMPLANT
BAG COUNTER SPONGE EZ (MISCELLANEOUS) ×2 IMPLANT
CANISTER SUCT 3000ML PPV (MISCELLANEOUS) ×2 IMPLANT
CHLORAPREP W/TINT 26 (MISCELLANEOUS) ×4 IMPLANT
COVER WAND RF STERILE (DRAPES) ×2 IMPLANT
DERMABOND ADVANCED (GAUZE/BANDAGES/DRESSINGS) ×1
DERMABOND ADVANCED .7 DNX12 (GAUZE/BANDAGES/DRESSINGS) ×1 IMPLANT
DRSG TELFA 3X8 NADH (GAUZE/BANDAGES/DRESSINGS) ×2 IMPLANT
GAUZE SPONGE 4X4 12PLY STRL (GAUZE/BANDAGES/DRESSINGS) ×2 IMPLANT
GLOVE BIOGEL PI ORTHO PRO 7.5 (GLOVE) ×1
GLOVE PI ORTHO PRO STRL 7.5 (GLOVE) ×1 IMPLANT
GOWN STRL REUS W/ TWL LRG LVL3 (GOWN DISPOSABLE) ×2 IMPLANT
GOWN STRL REUS W/TWL LRG LVL3 (GOWN DISPOSABLE) ×2
KIT TURNOVER KIT A (KITS) ×2 IMPLANT
MANIFOLD NEPTUNE II (INSTRUMENTS) ×2 IMPLANT
NS IRRIG 1000ML POUR BTL (IV SOLUTION) ×2 IMPLANT
PACK C SECTION AR (MISCELLANEOUS) ×2 IMPLANT
PAD OB MATERNITY 4.3X12.25 (PERSONAL CARE ITEMS) ×2 IMPLANT
PAD PREP 24X41 OB/GYN DISP (PERSONAL CARE ITEMS) ×2 IMPLANT
PENCIL SMOKE EVACUATOR (MISCELLANEOUS) ×2 IMPLANT
RETRACTOR WND ALEXIS-O 25 LRG (MISCELLANEOUS) ×1 IMPLANT
RTRCTR C-SECT PINK 25CM LRG (MISCELLANEOUS) ×2 IMPLANT
RTRCTR WOUND ALEXIS O 25CM LRG (MISCELLANEOUS) ×2
SPONGE LAP 18X18 RF (DISPOSABLE) ×2 IMPLANT
SUT VIC AB 0 CTX 36 (SUTURE) ×2
SUT VIC AB 0 CTX36XBRD ANBCTRL (SUTURE) ×2 IMPLANT
SUT VIC AB 1 CT1 36 (SUTURE) ×4 IMPLANT
SUT VICRYL 3-0 36IN CTB-1 (SUTURE) ×2 IMPLANT
SUT VICRYL+ 3-0 36IN CT-1 (SUTURE) ×4 IMPLANT
TAPE PAPER 2X10 WHT MICROPORE (GAUZE/BANDAGES/DRESSINGS) ×2 IMPLANT

## 2021-01-08 NOTE — Anesthesia Preprocedure Evaluation (Signed)
Anesthesia Evaluation  Patient identified by MRN, date of birth, ID band Patient awake  General Assessment Comment:Secondary c/section for transverse lie. Prior C/S with spinal, no issues  Reviewed: Allergy & Precautions, NPO status , Patient's Chart, lab work & pertinent test results  History of Anesthesia Complications Negative for: history of anesthetic complications  Airway Mallampati: II  TM Distance: >3 FB Neck ROM: Full    Dental no notable dental hx. (+) Teeth Intact   Pulmonary neg pulmonary ROS, neg sleep apnea, neg COPD, Patient abstained from smoking.Not current smoker,  Childhood asthma, none as adult   Pulmonary exam normal breath sounds clear to auscultation       Cardiovascular Exercise Tolerance: Good METS(-) hypertension(-) CAD and (-) Past MI negative cardio ROS  (-) dysrhythmias  Rhythm:Regular Rate:Normal - Systolic murmurs    Neuro/Psych negative neurological ROS  negative psych ROS   GI/Hepatic neg GERD  ,(+)     (-) substance abuse  ,   Endo/Other  neg diabetes  Renal/GU negative Renal ROS     Musculoskeletal   Abdominal (+) + obese,   Peds  Hematology   Anesthesia Other Findings Past Medical History: No date: Asthma 12/21/2020: COVID-19 No date: Inguinal hernia  Reproductive/Obstetrics (+) Pregnancy                             Anesthesia Physical Anesthesia Plan  ASA: II  Anesthesia Plan: Spinal   Post-op Pain Management:    Induction:   PONV Risk Score and Plan: 4 or greater and Ondansetron and Dexamethasone  Airway Management Planned: Natural Airway  Additional Equipment:   Intra-op Plan:   Post-operative Plan:   Informed Consent: I have reviewed the patients History and Physical, chart, labs and discussed the procedure including the risks, benefits and alternatives for the proposed anesthesia with the patient or authorized representative  who has indicated his/her understanding and acceptance.       Plan Discussed with: CRNA and Surgeon  Anesthesia Plan Comments: (Discussed R/B/A of neuraxial anesthesia technique with patient: - rare risks of spinal/epidural hematoma, nerve damage, infection - Risk of PDPH - Risk of itching - Risk of nausea and vomiting - Risk of conversion to general anesthesia and its associated risks, including sore throat, damage to lips/teeth/oropharynx, and rare risks such as cardiac and respiratory events. - Risk of surgical bleeding requiring blood products Patient voiced understanding.)        Anesthesia Quick Evaluation

## 2021-01-08 NOTE — Op Note (Signed)
      OP NOTE  Date: 01/08/2021   9:33 AM Name Vanessa Mullins MR# [id number]  Preoperative Diagnosis: 1. Intrauterine pregnancy at [redacted]w[redacted]d Active Problems:   Term pregnancy, repeat  2.  Transverse fetal lie  Postoperative Diagnosis: 1. Intrauterine pregnancy at [redacted]w[redacted]d, delivered 2. Viable infant 3. Remainder same as pre-op  4.  Bicornuate uterus   Procedure: 1.  Repeat Low-Transverse Cesarean Section  Surgeon: Elonda Husky, MD  Assistant: Serafina Royals, CNM  No other capable assistant was available for this surgery which requires an experienced, high level assistant.  She provided exposure, dissection, suctioning, retraction, and general support and assistance during the procedure.  Anesthesia: Spinal    EBL: 625  ml     Findings: 1) female infant, Apgar scores of 8   at 1 minute and 9   at 5 minutes and a birthweight of 125.93  ounces.    2) Normal uterus, tubes and ovaries.    Procedure:  The patient was prepped and draped in the supine position and placed under spinal anesthesia.  A transverse incision was made across the abdomen in a Pfannenstiel manner. If indicated the old scar was systematically removed with sharp dissection.  We carried the dissection down to the level of the fascia.  The fascia was incised in a curvilinear manner.  The fascia was then elevated from the rectus muscles with blunt and sharp dissection.  The rectus muscles were separated laterally exposing the peritoneum.  The peritoneum was carefully entered with care being taken to avoid bowel and bladder.  A self-retaining retractor was placed.  The visceral peritoneum was incised in a curvilinear fashion across the lower uterine segment creating a bladder flap. A transverse incision was made across the lower uterine segment and extended laterally and superiorly using the bandage scissors.  Artificial rupture membranes was performed and Clear fluid was noted.  The infant was delivered from the  transverse position - delivered as frank breech.  A nuchal cord was not present. After an appropriate time interval, the cord was doubly clamped and cut. Cord blood was obtained if required.  The infant was handed to the pediatric personnel  who then placed the infant under heat lamps where it was cleaned dried and suctioned as needed. The placenta was delivered. The hysterotomy incision was then identified on ring forceps.  The uterine cavity was cleaned with a moist lap sponge.  The hysterotomy incision was closed with a running interlocking suture of Vicryl.  Hemostasis was excellent.  Pitocin was run in the IV and the uterus was found to be firm. Careful examination of the uterus was performed and it appears to be bicornuate.  The pregnancy was located in the left horn. The posterior cul-de-sac and gutters were cleaned and inspected.  Hemostasis was noted.  The fascia was then closed with a running suture of #1 Vicryl.  Hemostasis of the subcutaneous tissues was obtained using the Bovie.  The subcutaneous tissues were closed with a running suture of 000 Vicryl.  A subcuticular suture was placed.  Dermabond was placed as a long-term Bioclusive dressing.  A pressure dressing was placed.  The patient went to the recovery room in stable condition.   Elonda Husky, M.D. 01/08/2021 9:33 AM

## 2021-01-08 NOTE — Transfer of Care (Signed)
Immediate Anesthesia Transfer of Care Note  Patient: Vanessa Mullins  Procedure(s) Performed: CESAREAN SECTION (N/A )  Patient Location: PACU and Mother/Baby  Anesthesia Type:Spinal  Level of Consciousness: awake, alert , oriented and patient cooperative  Airway & Oxygen Therapy: Patient Spontanous Breathing  Post-op Assessment: Report given to RN, Post -op Vital signs reviewed and stable and Patient moving all extremities X 4  Post vital signs: stable  Last Vitals:  Vitals Value Taken Time  BP 104/67   Temp 98.2   Pulse 70   Resp 17   SpO2 98     Last Pain:  Vitals:   01/08/21 0635  TempSrc:   PainSc: 0-No pain         Complications: No complications documented.

## 2021-01-08 NOTE — Lactation Note (Signed)
This note was copied from a baby's chart. Lactation Consultation Note  Patient Name: Vanessa Mullins NIDPO'E Date: 01/08/2021 Reason for consult: Initial assessment Age:26 hours  Lactation to the room for initial visit. Baby was asleep,swaddled in bassinet, Mother stated he last fed at 0930 am. Encouraged feeding on demand and with cues. If baby is not cueing encouraged hand expression and skin to skin. Taught proper technique for hand expression and spoon feeding. LC and Mother expressed and fed 23ml to baby. Baby tolerated spoon feed well, and was not interested in latching at all. Baby was left swaddled with the FOB. Encouraged 8 or more attempts in the first 24 hours and 8 or more good feeds after 24 HOL. Reviewed appropriate diapers for days of life and How to know your baby is getting enough to eat. Reviewed "Understanding Postpartum and Newborn Care"INJOY booklet at bedside. [facility] # left on board, encouraged to call for any assistance. Mother has no further questions at this time.    Maternal Data Has patient been taught Hand Expression?: Yes Does the patient have breastfeeding experience prior to this delivery?: Yes How long did the patient breastfeed?: 2 months  Feeding Mother's Current Feeding Choice: Breast Milk  Interventions Interventions: Breast feeding basics reviewed;Assisted with latch;Hand express;Adjust position;Support pillows;Position options;Education  Discharge Pump: Personal (Has a medela pump in style at home)  Consult Status Consult Status: Follow-up Date: 01/09/21 Follow-up type: In-patient    Vanessa Mullins D Vanessa Mullins 01/08/2021, 2:20 PM

## 2021-01-08 NOTE — H&P (Signed)
History and Physical   HPI  Vanessa Mullins is a 25 y.o. G2P1001 at [redacted]w[redacted]d Estimated Date of Delivery: 01/14/21 who is being admitted for C-section.  Her baby has been transverse and she believes it is still transverse.  At this time she desires repeat CD regardless of position.   OB History  OB History  Gravida Para Term Preterm AB Living  2 1 1  0 0 1  SAB IAB Ectopic Multiple Live Births  0 0 0 0 1    # Outcome Date GA Lbr Len/2nd Weight Sex Delivery Anes PTL Lv  2 Current           1 Term 06/05/14 [redacted]w[redacted]d  2920 g M CS-Unspec Spinal  LIV    PROBLEM LIST  Pregnancy complications or risks: Patient Active Problem List   Diagnosis Date Noted  . Term pregnancy, repeat 01/08/2021  . COVID-19 12/21/2020  . Abdominal pain affecting pregnancy 12/21/2020  . Breech presentation of fetus 12/21/2020  . Type B blood, Rh negative 06/15/2020  . Previous cesarean section 06/15/2020  . Patient desires vaginal birth after cesarean section (VBAC) 06/15/2020  . Maternal varicella, non-immune 06/15/2020  . Obesity in pregnancy 06/15/2020    Prenatal labs and studies: ABO, Rh: --/--/B NEG Performed at [facility], [address] [place], [place]  [phone]) Antibody: POS (02/28 1102) Rubella: 5.22 (08/05 1422) RPR: Non Reactive (12/13 1017)  HBsAg: Negative (08/05 1422)  HIV: Non Reactive (08/05 1422)  12-02-1985-- (02/11 1823)   Past Medical History:  Diagnosis Date  . Asthma   . COVID-19 12/21/2020  . Inguinal hernia      Past Surgical History:  Procedure Laterality Date  . CESAREAN SECTION    . FRACTURE SURGERY     sedated for fracture armed setting  . TONSILLECTOMY    . WISDOM TOOTH EXTRACTION       Medications    Current Discharge Medication List    CONTINUE these medications which have NOT CHANGED   Details  acetaminophen (TYLENOL) 500 MG tablet Take 2 tablets (1,000 mg total) by mouth every 6 (six) hours as needed. Qty: 30  tablet, Refills: 0    Prenatal Vit-Fe Fumarate-FA (MULTIVITAMIN-PRENATAL) 27-0.8 MG TABS tablet Take 1 tablet by mouth daily at 12 noon.         Allergies  Amoxicillin, Garlic, Other, and Shellfish allergy  Review of Systems  Pertinent items noted in HPI and remainder of comprehensive ROS otherwise negative.  Physical Exam  BP 134/77   Pulse 84   Temp 98 F (36.7 C) (Oral)   Resp 16   Ht 5\' 5"  (1.651 m)   Wt 107 kg   LMP 04/09/2020   BMI 39.27 kg/m   Lungs:  CTA B Cardio: RRR without M/R/G Abd: Soft, gravid, NT Presentation: transverse EXT: No C/C/ 1+ Edema DTRs: 2+ B CERVIX:    See Prenatal records for more detailed PE.     FHR:  Variability: Good {> 6 bpm)  Toco: Uterine Contractions: None  Test Results  Results for orders placed or performed during the hospital encounter of 01/08/21 (from the past 24 hour(s))  ABO/Rh     Status: None   Collection Time: 01/08/21  6:21 AM  Result Value Ref Range   ABO/RH(D)      B NEG Performed at [facility], [address] [place], [address] [place]      Assessment   G2P1001 at 102w1d Estimated  Date of Delivery: 01/14/21  The fetus is reassuring.  Pt desires repeat CD Transverse lie  Patient Active Problem List   Diagnosis Date Noted  . Term pregnancy, repeat 01/08/2021  . COVID-19 12/21/2020  . Abdominal pain affecting pregnancy 12/21/2020  . Breech presentation of fetus 12/21/2020  . Type B blood, Rh negative 06/15/2020  . Previous cesarean section 06/15/2020  . Patient desires vaginal birth after cesarean section (VBAC) 06/15/2020  . Maternal varicella, non-immune 06/15/2020  . Obesity in pregnancy 06/15/2020    Plan  1. Admit to L&D :   2. EFM: -- Category 1 3. Abx 4. Admission labs  5. Repeat CD  Elonda Husky, M.D. 01/08/2021 7:50 AM

## 2021-01-08 NOTE — Plan of Care (Signed)
Patient delivered baby at 0831 this morning via cesarean section. Patient recovering well and patient pain managed well. Patient and family bonding well with baby and patient breastfeeding and reports no concerns. Patient finishing recovery with plans to transport to mother baby unit for couplet care.

## 2021-01-08 NOTE — Anesthesia Procedure Notes (Addendum)
Spinal  Patient location during procedure: OR Start time: 01/08/2021 7:55 AM End time: 01/08/2021 7:59 AM Staffing Performed: resident/CRNA  Anesthesiologist: Zak, Arthur, MD Resident/CRNA: Starr, Deana, CRNA Preanesthetic Checklist Completed: patient identified, IV checked, site marked, risks and benefits discussed, surgical consent, monitors and equipment checked, pre-op evaluation and timeout performed Spinal Block Patient position: sitting Prep: Betadine Patient monitoring: heart rate, continuous pulse ox, blood pressure and cardiac monitor Approach: midline Location: L4-5 Injection technique: single-shot Needle Needle type: Introducer and Pencan  Needle gauge: 24 G Needle length: 9 cm Catheter size: 20 g Assessment Sensory level: T4 Additional Notes Negative paresthesia. Negative blood return. Positive free-flowing CSF. Expiration date of kit checked and confirmed. Patient tolerated procedure well, without complications.       

## 2021-01-09 ENCOUNTER — Encounter: Payer: Self-pay | Admitting: Obstetrics and Gynecology

## 2021-01-09 MED ORDER — GABAPENTIN 100 MG PO CAPS
100.0000 mg | ORAL_CAPSULE | Freq: Two times a day (BID) | ORAL | Status: DC
  Administered 2021-01-09 – 2021-01-10 (×3): 100 mg via ORAL
  Filled 2021-01-09 (×3): qty 1

## 2021-01-09 MED ORDER — ACETAMINOPHEN 500 MG PO TABS
1000.0000 mg | ORAL_TABLET | Freq: Four times a day (QID) | ORAL | Status: DC | PRN
  Administered 2021-01-09 – 2021-01-10 (×3): 1000 mg via ORAL
  Filled 2021-01-09 (×4): qty 2

## 2021-01-09 MED ORDER — COCONUT OIL OIL
TOPICAL_OIL | Status: AC
  Filled 2021-01-09: qty 120

## 2021-01-09 NOTE — Anesthesia Postprocedure Evaluation (Signed)
Anesthesia Post Note  Patient: Vanessa Mullins  Procedure(s) Performed: CESAREAN SECTION (N/A )  Patient location during evaluation: Mother Baby Anesthesia Type: Spinal Level of consciousness: oriented and awake and alert Pain management: pain level controlled Vital Signs Assessment: post-procedure vital signs reviewed and stable Respiratory status: spontaneous breathing and respiratory function stable Cardiovascular status: blood pressure returned to baseline and stable Postop Assessment: no headache, no backache, no apparent nausea or vomiting and able to ambulate Anesthetic complications: no   No complications documented.   Last Vitals:  Vitals:   01/09/21 0600 01/09/21 0803  BP:  125/61  Pulse: 93 79  Resp:  20  Temp:  36.9 C  SpO2: 96% 99%    Last Pain:  Vitals:   01/09/21 0803  TempSrc: Oral  PainSc:                  Jeanine Luz

## 2021-01-09 NOTE — Lactation Note (Signed)
This note was copied from a baby's chart. Lactation Consultation Note  Patient Name: Vanessa Mullins [id number] Date: 01/09/2021 Reason for consult: Follow-up assessment;Term;Other (Comment) (c-section) Age:26 hours  Lactation follow-up. Mom reports some feeding concerns overnight, attempts that were unsuccessful and baby not seeming interested. Good lengths of feedings were noted in chart; and mom was given reassurance with baby's voids and stools. Mom has no discomfort when at the breast, and feels that she is getting better with position and pillow placement. The nipple shield has been used to begin feedings, but then parents are conscious to remove the shield after baby starts feeding; mom was praised for this awareness.  Baby did receive circumcision this morning, LC reviewed how this may affect feeding behaviors, encouraged feedings with cues and hand expression in the meantime if needed. Mom desires observation at next feeding attempts for reassurance of position and latch. Whiteboard updated with [facility] name and phone number.  Maternal Data Has patient been taught Hand Expression?: Yes Does the patient have breastfeeding experience prior to this delivery?: Yes  Feeding Mother's Current Feeding Choice: Breast Milk  LATCH Score                    Lactation Tools Discussed/Used Tools: Nipple Dorris Carnes (begins feed and then removes) Nipple shield size: 20  Interventions Interventions: Breast feeding basics reviewed;Education  Discharge    Consult Status Consult Status: Follow-up Date: 01/09/21 Follow-up type: In-patient    Danford Bad 01/09/2021, 9:55 AM

## 2021-01-09 NOTE — Progress Notes (Signed)
Pressure dressing removed. Cleansed around area. Telfa put on top of incision to prevent rubbing.

## 2021-01-09 NOTE — Progress Notes (Signed)
Patient ID: Vanessa Mullins, female   DOB: 1995/12/21, 25 y.o.   MRN: [id number]    Progress Note - Cesarean Delivery  Vanessa Mullins is a 25 y.o. G2P2002 now PP day 1 s/p C-Section, Low Transverse .   Subjective:  Patient reports no problems with eating, bowel movements, voiding, or their wound  She says her pain is easily controlled.  She has been out of bed to the bathroom and walking around her room without issue.  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 Doing well postoperatively.     Plan:       Continue current care.  Probable discharge tomorrow  Elonda Husky, M.D. 01/09/2021 9:31 AM

## 2021-01-10 MED ORDER — SIMETHICONE 80 MG PO CHEW: 80.0000 mg | CHEWABLE_TABLET | Freq: Four times a day (QID) | ORAL | 0 refills | Status: DC

## 2021-01-10 MED ORDER — GABAPENTIN 100 MG PO CAPS: 100.0000 mg | ORAL_CAPSULE | Freq: Two times a day (BID) | ORAL | 0 refills | Status: DC

## 2021-01-10 MED ORDER — VARICELLA VIRUS VACCINE LIVE 1350 PFU/0.5ML IJ SUSR
0.5000 mL | Freq: Once | INTRAMUSCULAR | Status: AC
  Administered 2021-01-10: 0.5 mL via SUBCUTANEOUS
  Filled 2021-01-10: qty 0.5

## 2021-01-10 MED ORDER — IBUPROFEN 800 MG PO TABS: 800.0000 mg | ORAL_TABLET | Freq: Three times a day (TID) | ORAL | 0 refills | Status: DC

## 2021-01-10 NOTE — Progress Notes (Signed)
Discharge order received from doctor. Varicella vaccine given at discharge. Reviewed discharge instructions and prescriptions with patient and answered all questions. Follow up appointment instructions given. Patient verbalized understanding. ID bands checked. Patient discharged home with infant via wheelchair by nursing/auxillary.    Inge Rise, RN

## 2021-01-10 NOTE — Lactation Note (Signed)
This note was copied from a baby's chart. Lactation Consultation Note  Patient Name: Vanessa Mullins [id number] Date: 01/10/2021 Reason for consult: Follow-up assessment;Term;Other (Comment) (c-section) Age:26 hours  Lactation follow-up prior to discharge. Mom has been exclusively BF since delivery; baby has had numerous voids/stools, low bilirubin levels, no pain/discomfort with BF- all signs of good transfer.  Reviewed with mom early feeding cues, growth spurts and cluster feeding, feeding on demand, newborn changes and behaviors, ongoing output expectations, breast and nipple care, breast fullness and engorgement and management of both. Reviewed milk supply and demand, normal course of lactation, and outpatient support and breastfeeding community resources. Encouraged to call with questions/concerns or for ongoing BF support.  Maternal Data Has patient been taught Hand Expression?: Yes Does the patient have breastfeeding experience prior to this delivery?: Yes How long did the patient breastfeed?: 2 months  Feeding Mother's Current Feeding Choice: Breast Milk  LATCH Score Latch: Grasps breast easily, tongue down, lips flanged, rhythmical sucking.  Audible Swallowing: Spontaneous and intermittent  Type of Nipple: Everted at rest and after stimulation  Comfort (Breast/Nipple): Soft / non-tender  Hold (Positioning): No assistance needed to correctly position infant at breast.  LATCH Score: 10   Lactation Tools Discussed/Used Tools:  (no nipple shield used)  Interventions Interventions: Breast feeding basics reviewed;Education  Discharge Discharge Education: Engorgement and breast care;Warning signs for feeding baby;Outpatient recommendation  Consult Status Consult Status: Complete Date: 01/10/21 Follow-up type: Call as needed    Vanessa Mullins 01/10/2021, 10:10 AM

## 2021-01-10 NOTE — Discharge Summary (Signed)
Obstetric Discharge Summary  Patient ID: Vanessa Mullins MRN: [id number] DOB/AGE: 12/18/1995 25 y.o.   Date of Admission: 01/08/2021  Date of Discharge:  01/10/21  Admitting Diagnosis: Scheduled cesarean section at [redacted]w[redacted]d  Secondary Diagnosis:   Patient Active Problem List   Diagnosis Date Noted  . Term pregnancy, repeat 01/08/2021  . 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 Status post repeat low transverse cesarean section   . [redacted] weeks gestation of pregnancy   . COVID-19 12/21/2020  . Abdominal pain affecting pregnancy 12/21/2020  . Breech presentation of fetus 12/21/2020  . Type B blood, Rh negative 06/15/2020  . Previous cesarean section 06/15/2020  . Patient desires vaginal birth after cesarean section (VBAC) 06/15/2020  . Maternal varicella, non-immune 06/15/2020  . Obesity in pregnancy 06/15/2020    Mode of Delivery: repeat cesarean section- low uterine, transverse     Discharge Diagnosis: No other diagnosis   Intrapartum Procedures: Atificial rupture of membranes and Spinal   Post partum procedures: varicella vaccine  Complications: none   Brief Hospital Course    Vanessa Mullins is a [id number] who underwent cesarean section on 01/08/2021.  Patient had an uncomplicated surgery; for further details, please refer to the operative note.  Patient had an uncomplicated postpartum course.  By time of discharge on POD#2/PPD#2, her pain was controlled on oral pain medications; she had appropriate lochia and was ambulating, voiding without difficulty, tolerating regular diet and passing flatus.   She was deemed stable for discharge to home.    Labs: CBC Latest Ref Rng & Units 01/07/2021 10/22/2020 07/09/2020  WBC 4.0 - 10.5 K/uL 7.7 9.0 8.0  Hemoglobin 12.0 - 15.0 g/dL 11.8(L) 11.7 12.5  Hematocrit 36.0 - 46.0 % 34.6(L) 34.1 36.7  Platelets 150 - 400 K/uL 238 225 227   B NEG Performed at [facility], [address] [place], [place] [address]   Physical exam:   Temp:  [98.1 F (36.7 C)-98.4 F (36.9 C)] 98.1 F (36.7 C) (03/03 0814) Pulse Rate:  [81-88] 88 (03/03 0814) Resp:  [18-20] 18 (03/03 0814) BP: (128-132)/(63-81) 128/81 (03/03 0814) SpO2:  [99 %-100 %] 99 % (03/03 0814)  General: alert and no distress  Lochia: appropriate  Abdomen: soft, NT  Uterine Fundus: firm  Incision: healing well, no significant drainage, no dehiscence, no significant erythema  Extremities: No evidence of DVT seen on physical exam. No lower extremity edema.  Discharge Instructions: Per After Visit Summary.  Activity: Advance as tolerated. Pelvic rest for 6 weeks.  Also refer to After Visit Summary  Diet: Regular  Medications: Allergies as of 01/10/2021      Reactions   Amoxicillin Anaphylaxis, Rash   Other Other (See Comments)   Numbness in fingers   Shellfish Allergy    Fingers tingle, edema      Medication List    TAKE these medications   acetaminophen 500 MG tablet Commonly known as: TYLENOL Take 2 tablets (1,000 mg total) by mouth every 6 (six) hours as needed. What changed: reasons to take this   gabapentin 100 MG capsule Commonly known as: NEURONTIN Take 1 capsule (100 mg total) by mouth 2 (two) times daily.   ibuprofen 800 MG tablet Commonly known as: ADVIL Take 1 tablet (800 mg total) by mouth every 8 (eight) hours.   multivitamin-prenatal 27-0.8 MG Tabs tablet Take 1 tablet by mouth daily at 12 noon.   simethicone 80 MG chewable tablet Commonly known as: MYLICON Chew 1  tablet (80 mg total) by mouth 4 (four) times daily.            Discharge Care Instructions  (From admission, onward)         Start     Ordered   01/10/21 0000  Discharge wound care:       Comments: See AVS   01/10/21 1259         Outpatient follow up:   Follow-up Information    Gunnar Bulla, CNM. Schedule an appointment as soon as possible for a visit on 01/14/2021.   Specialties:  Certified Nurse Midwife, Obstetrics and Gynecology, Radiology Why: Someone from the office will call you to schedule an incision check on Monday Contact information: [address] [place] [phone]              Postpartum contraception: Posey Rea, will discuss further at postpartum visit  Discharged Condition: stable  Discharged to: home   Newborn Data:  Disposition:home with mother  Apgars: APGAR (1 MIN): 8   APGAR (5 MINS): 9    Baby Feeding: Breast   Serafina Royals, CNM  [facility], [facility] 01/10/21 1:01 PM

## 2021-01-10 NOTE — Discharge Instructions (Signed)
Please call your doctor or return to the ER if you experience any chest pains, shortness of breath, dizziness, visual changes, severe headache (unrelieved by pain meds), fever greater than 100.4, any heavy bleeding (saturating more than 1 pad per hour), large clots, or foul smelling discharge, any worsening abdominal pain and cramping that is not controlled by pain medication, any calf/leg pain or redness, any breast concerns (redness/pain), or any signs of postpartum depression. No tampons, enemas, douches, or sexual intercourse for 6 weeks. Also avoid tub baths, hot tubs, or swimming for 6 weeks.   Check your incision daily for any signs of infection such as redness, warmth, swelling, increased pain, or pus/foul smelling drainage  Activity: do not lift over 10 lbs for 6 weeks  No driving for 1-2 weeks  Pelvic rest for 6 weeks  Showers only for 6 weeks (no tub baths)

## 2021-01-10 NOTE — Progress Notes (Signed)
Subjective: Postpartum Day 3 : Cesarean Delivery Patient in room holding infant, sitting in bed, and husband in room for support.  Patient reports tolerating PO, + flatus, + BM and no problems voiding.   Breastfeeding is going well, no nipple soreness.   Denies pain while sitting or resting, respiratory distress, breathing difficulty, chest pain, and leg pain.  Objective: Vital signs in last 24 hours: Temp:  [98.1 F (36.7 C)-98.4 F (36.9 C)] 98.1 F (36.7 C) (03/03 0814) Pulse Rate:  [81-88] 88 (03/03 0814) Resp:  [18-20] 18 (03/03 0814) BP: (128-132)/(63-81) 128/81 (03/03 0814) SpO2:  [99 %-100 %] 99 % (03/03 0814)  Physical Exam:  General: alert, cooperative, appears stated age and no distress Lochia: appropriate Uterine Fundus: firm Incision: healing well, no significant drainage, no dehiscence, no significant erythema DVT Evaluation: No evidence of DVT seen on physical exam. Negative Homan's sign. No cords or calf tenderness. No significant calf/ankle edema.  No results for input(s): HGB, HCT in the last 72 hours.  Assessment/Plan: Status post Cesarean section. Doing well postoperatively.   Rx. For pain medication, see orders  Reviewed red flag symptoms and when to call  Discharge home with standard precautions  RTC x 1 week for incision check and RTC x 4-6 weeks for ROB with JML or sooner if needed  Juliann Pares, [employer] 01/10/2021, 1:04 PM

## 2021-01-11 ENCOUNTER — Telehealth: Payer: Self-pay | Admitting: Certified Nurse Midwife

## 2021-01-11 NOTE — Telephone Encounter (Signed)
New message    Patient came home yesterday after C-section, the wound site looks discolored will explain more in detail when the nurse called back.

## 2021-01-11 NOTE — Telephone Encounter (Signed)
LMTRC

## 2021-01-14 ENCOUNTER — Ambulatory Visit (INDEPENDENT_AMBULATORY_CARE_PROVIDER_SITE_OTHER): Payer: BC Managed Care – PPO | Admitting: Certified Nurse Midwife

## 2021-01-14 ENCOUNTER — Encounter: Payer: Self-pay | Admitting: Certified Nurse Midwife

## 2021-01-14 VITALS — BP 122/78 | HR 98 | Ht 65.0 in | Wt 227.3 lb

## 2021-01-14 DIAGNOSIS — Z789 Other specified health status: Secondary | ICD-10-CM

## 2021-01-14 NOTE — Progress Notes (Signed)
Patient comes in today for incision check. Patient has been having dizzy spells since yesterday.

## 2021-01-14 NOTE — Progress Notes (Signed)
    OBSTETRICS/GYNECOLOGY POST-OPERATIVE CLINIC VISIT  Subjective:     Vanessa Mullins is a 25 y.o. female who presents to the clinic 1 weeks status post cesarean section  for Transverse fetal lie. Eating a regular diet without difficulty. Bowel movements are normal. Pain is controlled with current analgesics. Medications being used: acetaminophen, ibuprofen (OTC) and Gabapentin.   Reports mild dizziness yesterday after taking her gabapentin, denies dizziness this visit.  Denies difficulty breathing, respiratory distress, chest pain, excessive vaginal bleeding, and leg pain or swelling.  The following portions of the patient's history were reviewed and updated as appropriate: allergies, current medications, past family history, past medical history, past social history, past surgical history and problem list.  Review of Systems  Pertinent items are noted in HPI.    Objective:    BP 122/78 (BP Location: Left Arm, Patient Position: Sitting)   Pulse 98   Ht 5\' 5"  (1.651 m)   Wt 227 lb 4.8 oz (103.1 kg)   BMI 37.82 kg/m    General:  alert and no distress  Abdomen: soft, bowel sounds active, non-tender  Incision:   healing well, no drainage, no erythema, no hernia, no seroma, no swelling, no dehiscence, incision well approximated     Assessment:   Presence of surgical incision  Postpartum care and examination  Lactating mother   Plan:   Continue any current medications.  Wound care discussed.  Activity restrictions: no driving, no gym class, no lifting more than 10 pounds and no overhead lifting  Anticipated return to work: 6 weeks.  Reviewed red flag symptoms and when to call.  Follow up: 5 weeks for 6 week postpartum visit   Juliya Magill , Student-MidWife [facility] 01/14/21 9:52 AM

## 2021-01-14 NOTE — Patient Instructions (Addendum)
Postpartum Baby Blues The postpartum period begins right after the birth of a baby. During this time, there is often joy and excitement. It is also a time of many changes in the life of the parents. A mother may feel happy one minute and sad or stressed the next. These feelings of sadness, called the baby blues, usually happen in the period right after the baby is born and go away within a week or two. What are the causes? The exact cause of this condition is not known. Changes in hormone levels after childbirth are believed to trigger some of the symptoms. Other factors that can play a role in these mood changes include:  Lack of sleep.  Stressful life events, such as financial problems, caring for a loved one, or death of a loved one.  Genetics. What are the signs or symptoms? Symptoms of this condition include:  Changes in mood, such as going from extreme happiness to sadness.  A decrease in concentration.  Difficulty sleeping.  Crying spells and tearfulness.  Loss of appetite.  Irritability.  Anxiety. If these symptoms last for more than 2 weeks or become more severe, you may have postpartum depression. How is this diagnosed? This condition is diagnosed based on an evaluation of your symptoms. Your health care provider may use a screening tool that includes a list of questions to help identify a person with the baby blues or postpartum depression. How is this treated? The baby blues usually go away on their own in 1-2 weeks. Social support is often what is needed. You will be encouraged to get adequate sleep and rest. Follow these instructions at home: Lifestyle  Get as much rest as you can. Take a nap when the baby sleeps.  Exercise regularly as told by your health care provider. Some women find yoga and walking to be helpful.  Eat a balanced and nourishing diet. This includes plenty of fruits and vegetables, whole grains, and lean proteins.  Do little things that you  enjoy. Take a bubble bath, read your favorite magazine, or listen to your favorite music.  Avoid alcohol.  Ask for help with household chores, cooking, grocery shopping, or running errands. Do not try to do everything yourself. Consider hiring a postpartum doula to help. This is a professional who specializes in providing support to new mothers.  Try not to make any major life changes during pregnancy or right after giving birth. This can add stress.      General instructions  Talk to people close to you about how you are feeling. Get support from your partner, family members, friends, or other new moms. You may want to join a support group.  Find ways to manage stress. This may include: ? Writing your thoughts and feelings in a journal. ? Spending time outside. ? Spending time with people who make you laugh.  Try to stay positive in how you think. Think about the things you are grateful for.  Take over-the-counter and prescription medicines only as told by your health care provider.  Let your health care provider know if you have any concerns.  Keep all postpartum visits. This is important. Contact a health care provider if:  Your baby blues do not go away after 2 weeks. Get help right away if:  You have thoughts of taking your own life (suicidal thoughts), or of harming your baby or someone else.  You see or hear things that are not there (hallucinations). If you ever feel like you   may hurt yourself or others, or have thoughts about taking your own life, get help right away. Go to your nearest emergency department or:  Call your local emergency services (911 in the [place]  Call a suicide crisis helpline, such as the National Suicide Prevention Lifeline, at 410-242-9676. This is open 24 hours a day in the [place]  Text the Crisis Text Line at 878-780-6872 (in the [place] Summary  After giving birth, you may feel happy one minute and sad or stressed the next. Feelings of sadness  that happen right after the baby is born and go away after a week or two are called the baby blues.  You can manage the baby blues by getting enough rest, eating a healthy diet, exercising, spending time with supportive people, and finding ways to manage stress.  If feelings of sadness and stress last longer than 2 weeks or get in the way of caring for your baby, talk with your health care provider. This may mean you have postpartum depression. This information is not intended to replace advice given to you by your health care provider. Make sure you discuss any questions you have with your health care provider. Document Revised: 04/20/2020 Document Reviewed: 04/20/2020 Elsevier Patient Education  2021 [employer]vier Inc[employer]   Cesarean Delivery, Care After This sheet gives you information about how to care for yourself after your procedure. Your health care provider may also give you more specific instructions. If you have problems or questions, contact your health care provider. What can I expect after the procedure? After the procedure, it is common to have:  A small amount of blood or clear fluid coming from the incision.  Some redness, swelling, and pain in your incision area.  Some abdominal pain and soreness.  Vaginal bleeding (lochia). Even though you did not have a vaginal delivery, you will still have vaginal bleeding and discharge.  Pelvic cramps.  Fatigue. You may have pain, swelling, and discomfort in the tissue between your vagina and your anus (perineum) if:  Your C-section was unplanned, and you were allowed to labor and push.  An incision was made in the area (episiotomy) or the tissue tore during attempted vaginal delivery. Follow these instructions at home: Incision care  Follow instructions from your health care provider about how to take care of your incision. Make sure you: ? Wash your hands with soap and water before you change your bandage (dressing). If soap and  water are not available, use hand sanitizer. ? If you have a dressing, change it or remove it as told by your health care provider. ? Leave stitches (sutures), skin staples, skin glue, or adhesive strips in place. These skin closures may need to stay in place for 2 weeks or longer. If adhesive strip edges start to loosen and curl up, you may trim the loose edges. Do not remove adhesive strips completely unless your health care provider tells you to do that.  Check your incision area every day for signs of infection. Check for: ? More redness, swelling, or pain. ? More fluid or blood. ? Warmth. ? Pus or a bad smell.  Do not take baths, swim, or use a hot tub until your health care provider says it's okay. Ask your health care provider if you can take showers.  When you cough or sneeze, hug a pillow. This helps with pain and decreases the chance of your incision opening up (dehiscing). Do this until your incision heals.   Medicines  Take over-the-counter  and prescription medicines only as told by your health care provider.  If you were prescribed an antibiotic medicine, take it as told by your health care provider. Do not stop taking the antibiotic even if you start to feel better.  Do not drive or use heavy machinery while taking prescription pain medicine. Lifestyle  Do not drink alcohol. This is especially important if you are breastfeeding or taking pain medicine.  Do not use any products that contain nicotine or tobacco, such as cigarettes, e-cigarettes, and chewing tobacco. If you need help quitting, ask your health care provider. Eating and drinking  Drink at least 8 eight-ounce glasses of water every day unless told not to by your health care provider. If you breastfeed, you may need to drink even more water.  Eat high-fiber foods every day. These foods may help prevent or relieve constipation. High-fiber foods include: ? Whole grain cereals and breads. ? Brown  rice. ? Beans. ? Fresh fruits and vegetables. Activity  If possible, have someone help you care for your baby and help with household activities for at least a few days after you leave the hospital.  Return to your normal activities as told by your health care provider. Ask your health care provider what activities are safe for you.  Rest as much as possible. Try to rest or take a nap while your baby is sleeping.  Do not lift anything that is heavier than 10 lbs (4.5 kg), or the limit that you were told, until your health care provider says that it is safe.  Talk with your health care provider about when you can engage in sexual activity. This may depend on your: ? Risk of infection. ? How fast you heal. ? Comfort and desire to engage in sexual activity.   General instructions  Do not use tampons or douches until your health care provider approves.  Wear loose, comfortable clothing and a supportive and well-fitting bra.  Keep your perineum clean and dry. Wipe from front to back when you use the toilet.  If you pass a blood clot, save it and call your health care provider to discuss. Do not flush blood clots down the toilet before you get instructions from your health care provider.  Keep all follow-up visits for you and your baby as told by your health care provider. This is important. Contact a health care provider if:  You have: ? A fever. ? Bad-smelling vaginal discharge. ? Pus or a bad smell coming from your incision. ? Difficulty or pain when urinating. ? A sudden increase or decrease in the frequency of your bowel movements. ? More redness, swelling, or pain around your incision. ? More fluid or blood coming from your incision. ? A rash. ? Nausea. ? Little or no interest in activities you used to enjoy. ? Questions about caring for yourself or your baby.  Your incision feels warm to the touch.  Your breasts turn red or become painful or hard.  You feel unusually  sad or worried.  You vomit.  You pass a blood clot from your vagina.  You urinate more than usual.  You are dizzy or light-headed. Get help right away if:  You have: ? Pain that does not go away or get better with medicine. ? Chest pain. ? Difficulty breathing. ? Blurred vision or spots in your vision. ? Thoughts about hurting yourself or your baby. ? New pain in your abdomen or in one of your legs. ? A severe  headache.  You faint.  You bleed from your vagina so much that you fill more than one sanitary pad in one hour. Bleeding should not be heavier than your heaviest period. Summary  After the procedure, it is common to have pain at your incision site, abdominal cramping, and slight bleeding from your vagina.  Check your incision area every day for signs of infection.  Tell your health care provider about any unusual symptoms.  Keep all follow-up visits for you and your baby as told by your health care provider. This information is not intended to replace advice given to you by your health care provider. Make sure you discuss any questions you have with your health care provider. Document Revised: 05/05/2018 Document Reviewed: 05/05/2018 Elsevier Patient Education  2021 [employer].

## 2021-01-14 NOTE — Telephone Encounter (Signed)
Pt seen in clinic today.  

## 2021-01-14 NOTE — Progress Notes (Signed)
I have seen, interviewed, and examined the patient in conjunction with the [employer] [facility] [employer] and affirm the diagnosis and management plan.   Gunnar Bulla, CNM [facility], [facility] 01/14/21 11:38 AM

## 2021-01-15 ENCOUNTER — Other Ambulatory Visit: Payer: Self-pay

## 2021-01-15 ENCOUNTER — Encounter: Payer: Self-pay | Admitting: Obstetrics and Gynecology

## 2021-01-15 ENCOUNTER — Telehealth: Payer: Self-pay | Admitting: Obstetrics and Gynecology

## 2021-01-15 ENCOUNTER — Emergency Department
Admission: EM | Admit: 2021-01-15 | Discharge: 2021-01-15 | Discharge status: Absent without leave | Payer: BC Managed Care – PPO

## 2021-01-15 ENCOUNTER — Ambulatory Visit (INDEPENDENT_AMBULATORY_CARE_PROVIDER_SITE_OTHER): Payer: BC Managed Care – PPO | Admitting: Obstetrics and Gynecology

## 2021-01-15 VITALS — BP 138/94 | HR 91 | Ht 65.0 in | Wt 228.1 lb

## 2021-01-15 DIAGNOSIS — R030 Elevated blood-pressure reading, without diagnosis of hypertension: Secondary | ICD-10-CM

## 2021-01-15 NOTE — Progress Notes (Signed)
Pt present due to having headaches, elevated bp, dizziness, blurred vision and seeing floating spots.

## 2021-01-15 NOTE — Telephone Encounter (Signed)
New message    Patient C/o blood pressure issues today went to the fire department heading to ED now. 158/99   Wanted the Midwife Marcelino Duster to be aware.

## 2021-01-15 NOTE — Progress Notes (Signed)
° ° °  GYNECOLOGY PROGRESS NOTE  Subjective:    Patient ID: Vanessa Mullins, female    DOB: [birth year], 25 y.o.   MRN: [id number]  HPI  Patient is a 25 y.o. G64P2002 female who is 1 week postpartum s/p C-section who presents for complaints of headache (since yesterday), dizziness, blured vision, and seeing floaters.  She reports that her blood pressure was elevated at home, and so she went to a fire station where her BPs were noted to be 150s/90s.  Was instructed to f/u with OB/GYN office. Patient reports then going to the ER for further evaluation but notify the office  And was instructed to come to the office instead.   The following portions of the patient's history were reviewed and updated as appropriate: allergies, current medications, past family history, past medical history, past social history, past surgical history and problem list.  Review of Systems Pertinent items noted in HPI and remainder of comprehensive ROS otherwise negative.   Objective:   Blood pressure (!) 138/94, pulse 91, height 5\' 5"  (1.651 m), weight 228 lb 1.6 oz (103.5 kg), currently breastfeeding. Repeat BP in office118/82.  General appearance: alert and no distress CVS: S1 and S2 normal. RRR.  Lungs: CTAB Pelvis deferred.  Extremities with mild swelling bilaterally, no edema or tenderness   Assessment:   1. Elevated blood pressure reading   2. Postpartum state    Plan:   - Discussed with patient that there could be several causes for headache, including sleep deprivation, post-anesthesia headache, postpartum pre-eclampsia. Recommend evaluation, but given option for patient to have labs done in office, or proceed to L&D for further evaluation and initiation of magnesium sulfate If noted to have pre-eclampsia.  Patient desires to wait. Also has questions if she can bring her baby if she is admitted. Discussed hospital policy, can bring baby but must have a support person), Also advised on increasing caffiene  intake and getting some rest.  Will notify patient of lab results.    A total of 15 minutes were spent face-to-face with the patient during this encounter and over half of that time dealt with counseling and coordination of care.    , MD [facility]

## 2021-01-15 NOTE — Telephone Encounter (Signed)
Per JML to be seen today by ASC. Pt aware to head to the office.

## 2021-01-16 ENCOUNTER — Telehealth: Payer: Self-pay | Admitting: Obstetrics and Gynecology

## 2021-01-16 ENCOUNTER — Observation Stay
Admission: EM | Admit: 2021-01-16 | Discharge: 2021-01-17 | Disposition: A | Discharge status: Home / Self Care | Payer: BC Managed Care – PPO | Attending: Obstetrics and Gynecology | Admitting: Obstetrics and Gynecology

## 2021-01-16 ENCOUNTER — Other Ambulatory Visit: Payer: Self-pay

## 2021-01-16 ENCOUNTER — Encounter: Payer: Self-pay | Admitting: Obstetrics and Gynecology

## 2021-01-16 DIAGNOSIS — O9089 Other complications of the puerperium, not elsewhere classified: Principal | ICD-10-CM | POA: Insufficient documentation

## 2021-01-16 DIAGNOSIS — R519 Headache, unspecified: Secondary | ICD-10-CM | POA: Insufficient documentation

## 2021-01-16 LAB — COMPREHENSIVE METABOLIC PANEL
ALT: 17 IU/L (ref 0–32)
AST: 17 IU/L (ref 0–40)
Albumin/Globulin Ratio: 1.4 (ref 1.2–2.2)
Albumin: 3.6 g/dL — ABNORMAL LOW (ref 3.9–5.0)
Alkaline Phosphatase: 117 IU/L (ref 44–121)
BUN/Creatinine Ratio: 20 (ref 9–23)
BUN: 13 mg/dL (ref 6–20)
Bilirubin Total: <0.2 mg/dL (ref 0.0–1.2)
CO2: 22 mmol/L (ref 20–29)
Calcium: 8.8 mg/dL (ref 8.7–10.2)
Chloride: 105 mmol/L (ref 96–106)
Creatinine, Ser: 0.64 mg/dL (ref 0.57–1.00)
Globulin, Total: 2.6 g/dL (ref 1.5–4.5)
Glucose: 89 mg/dL (ref 65–99)
Potassium: 4.2 mmol/L (ref 3.5–5.2)
Sodium: 141 mmol/L (ref 134–144)
Total Protein: 6.2 g/dL (ref 6.0–8.5)
eGFR: 125 mL/min/{1.73_m2} (ref >59)

## 2021-01-16 LAB — CBC
Hematocrit: 31.4 % — ABNORMAL LOW (ref 34.0–46.6)
Hemoglobin: 10.5 g/dL — ABNORMAL LOW (ref 11.1–15.9)
MCH: 26.8 pg (ref 26.6–33.0)
MCHC: 33.4 g/dL (ref 31.5–35.7)
MCV: 80 fL (ref 79–97)
Platelets: 313 10*3/uL (ref 150–450)
RBC: 3.92 x10E6/uL (ref 3.77–5.28)
RDW: 13.6 % (ref 11.7–15.4)
WBC: 5.8 10*3/uL (ref 3.4–10.8)

## 2021-01-16 LAB — PROTEIN / CREATININE RATIO, URINE
Creatinine, Urine: 165.8 mg/dL
Protein, Ur: 58.7 mg/dL
Protein/Creat Ratio: 354 mg/g creat — ABNORMAL HIGH (ref 0–200)

## 2021-01-16 NOTE — Progress Notes (Incomplete)
    GYNECOLOGY PROGRESS NOTE  Subjective:    Patient ID: Vanessa Mullins, female    DOB: 05/27/1996, 25 y.o.   MRN: [id number]  HPI  Patient is a 25 y.o. G5P2002 female who is 1 week postpartum s/p C-section who presents for complaints of headache (since yesterday), dizziness, blured vision, and seeing floaters.  She reports that her blood pressure was elevated at home, and so she went to a fire station where her BPs were noted to be 150s/90s.  Was instructed to f/u with OB/GYN office. Patient reports then going to the ER for further evaluation but notify the office  And was instructed to come to the office instead.   The following portions of the patient's history were reviewed and updated as appropriate: allergies, current medications, past family history, past medical history, past social history, past surgical history and problem list.  Review of Systems Pertinent items noted in HPI and remainder of comprehensive ROS otherwise negative.   Objective:   Blood pressure (!) 138/94, pulse 91, height 5\' 5"  (1.651 m), weight 228 lb 1.6 oz (103.5 kg), currently breastfeeding. Repeat BP in office118/82.  General appearance: alert and no distress CVS: S1 and S2 normal. RRR.  Lungs: CTAB Pelvis deferred.  Extremities with mild swelling bilaterally, no edema or tenderness   Assessment:   1. Elevated blood pressure reading   2. Postpartum state    Plan:   - Discussed with patient that there could be several causes for headache, including sleep deprivation, post-anesthesia headache, postpartum pre-eclampsia. Recommend evaluation, but given option for patient to have labs done in office, or proceed to

## 2021-01-16 NOTE — Telephone Encounter (Signed)
Contacted patient regarding stat lab results. Overall negative for postpartum pre-eclampsia.  Patient reports that her headache has improved some since being seen in the office. Still noting some dizziness but otherwise feeling ok after having dinner, drinking a caffeinated beverage, and getting a little rest. Also took her next dose of Tylenol and Ibuprofen. Advised on continuation of her PNV, and to notify provider if symptoms begin to worsen again.    Hildred Laser, MD [facility]

## 2021-01-16 NOTE — OB Triage Note (Signed)
Pt is a PP C/S from 01-08-21 with complaints of elevated blood pressures. Pt states she went to the fire department and they got Bps of 144/97 and 144/110. Pt states she has had a headache since yesterday morning. Pt states on Saturday she had a dizzy spell. Pt denies abnormal swelling and any RUQ pain. Pt states she has had a lot of floaters. No clonus. +2 reflexes.

## 2021-01-17 ENCOUNTER — Encounter: Payer: Self-pay | Admitting: Obstetrics and Gynecology

## 2021-01-17 DIAGNOSIS — O9089 Other complications of the puerperium, not elsewhere classified: Secondary | ICD-10-CM | POA: Diagnosis not present

## 2021-01-17 LAB — COMPREHENSIVE METABOLIC PANEL
ALT: 17 U/L (ref 0–44)
AST: 18 U/L (ref 15–41)
Albumin: 3.1 g/dL — ABNORMAL LOW (ref 3.5–5.0)
Alkaline Phosphatase: 94 U/L (ref 38–126)
Anion gap: 9 (ref 5–15)
BUN: 15 mg/dL (ref 6–20)
CO2: 22 mmol/L (ref 22–32)
Calcium: 8.4 mg/dL — ABNORMAL LOW (ref 8.9–10.3)
Chloride: 109 mmol/L (ref 98–111)
Creatinine, Ser: 0.62 mg/dL (ref 0.44–1.00)
GFR, Estimated: >60 mL/min (ref >60)
Glucose, Bld: 92 mg/dL (ref 70–99)
Potassium: 3.6 mmol/L (ref 3.5–5.1)
Sodium: 140 mmol/L (ref 135–145)
Total Bilirubin: 0.4 mg/dL (ref 0.3–1.2)
Total Protein: 6.4 g/dL — ABNORMAL LOW (ref 6.5–8.1)

## 2021-01-17 LAB — CBC
HCT: 31.7 % — ABNORMAL LOW (ref 36.0–46.0)
Hemoglobin: 10.3 g/dL — ABNORMAL LOW (ref 12.0–15.0)
MCH: 26 pg (ref 26.0–34.0)
MCHC: 32.5 g/dL (ref 30.0–36.0)
MCV: 80.1 fL (ref 80.0–100.0)
Platelets: 305 10*3/uL (ref 150–400)
RBC: 3.96 MIL/uL (ref 3.87–5.11)
RDW: 13.6 % (ref 11.5–15.5)
WBC: 6.6 10*3/uL (ref 4.0–10.5)
nRBC: 0 % (ref 0.0–0.2)

## 2021-01-17 LAB — PROTEIN / CREATININE RATIO, URINE
Creatinine, Urine: 94 mg/dL
Protein Creatinine Ratio: 0.11 mg/mg{Cre} (ref 0.00–0.15)
Total Protein, Urine: 10 mg/dL

## 2021-01-17 NOTE — OB Triage Note (Signed)
L&D OB Triage Note  Vanessa Mullins is a 25 y.o. [phone] female repeat cesarean section on 3/1 presents 9 days postpartum to triage for complaints of headache and visiual changes. She was seen in the office 1 wk post operatively 3/7 with bp 122/78 and no complaints of headache or visual changes. She did complain of feeling dizzy after taking gabapentin. She was seen again on 3/8 by Dr.Cherry for complaint of headache, dizziness, blurred vision with floaters. Dr. Valentino Saxon completed pre eclamptic labs at that time. P/c ratios was elevated 354, all other labs were negative for pre eclampsia.   Objective:   She was evaluated by the nurses this evening with no significant findings for pre eclampsia. She has negative epigastric pain, normal reflexes and no clonus. . Vital signs stable.   CMP Latest Ref Rng & Units 01/17/2021 01/15/2021 01/07/2021  Glucose 70 - 99 mg/dL 92 89 82  BUN 6 - 20 mg/dL 15 13 8   Creatinine 0.44 - 1.00 mg/dL 5.46 2.70  Sodium 135 - 145 mmol/L 140 141 135  Potassium 3.5 - 5.1 mmol/L 3.6 4.2 4.2  Chloride 98 - 111 mmol/L 109 105 107  CO2 22 - 32 mmol/L 22 22 20(L)  Calcium 8.9 - 10.3 mg/dL 3.50) 8.8 0.9(F)  Total Protein 6.5 - 8.1 g/dL 6.4(L) 6.2 -  Total Bilirubin 0.3 - 1.2 mg/dL 0.4 8.1(W -  Alkaline Phos 38 - 126 U/L 94 117 -  AST 15 - 41 U/L 18 17 -  ALT 0 - 44 U/L 17 17 -   CBC Latest Ref Rng & Units 01/17/2021 01/15/2021 01/07/2021  WBC 4.0 - 10.5 K/uL 6.6 5.8 7.7  Hemoglobin 12.0 - 15.0 g/dL 10.3(L) 10.5(L) 11.8(L)  Hematocrit 36.0 - 46.0 % 31.7(L) 31.4(L) 34.6(L)  Platelets 150 - 400 K/uL 305 313 238    Protein / creatinine ratio, urine Order: 01/09/2021  Status: Final result   Visible to patient: Yes (not seen)   Next appt: 02/21/2021 at 08:30 AM in Obstetrics and Gynecology 02/23/2021, CNM)   0 Result Notes   Ref Range & Units 1 d ago  Creatinine, Urine mg/dL 94   Total Protein, Urine mg/dL 10   Comment: NO NORMAL RANGE ESTABLISHED FOR THIS  TEST  Protein Creatinine Ratio 0.00 - 0.15 mg/mg 0.11   Comment: Performed at [facility], [address] [place], [place]  Resulting Agency  [facility]         Specimen Collected: 01/16/21 23:53 Last Resulted: 01/17/21 00:30           Assessment: Negative pre- eclampsia,BP 120's/ 60-70's, pt states her headache has resolved with resting. Offered percocet but pt declines.   Plan: Consulted Dr. 03/19/21, he is in agreement to plan of care.  Discussed importance of rest, po hydration, eating properly to prevent headache. Discussed use on tylenol and Advil . Reviewed Red flag symptoms. Pt discharged home. Follow up prn     Logan Bores, CNM

## 2021-01-17 NOTE — Progress Notes (Signed)
Pt discharged home to self care. Pt denied need for additional pain medication to treat headache. Pt states she feels much better since laying down. Pt encouraged to stay hydrated, get rest and eat well. Pt verbalized understanding.

## 2021-02-21 ENCOUNTER — Encounter: Payer: BC Managed Care – PPO | Admitting: Certified Nurse Midwife

## 2021-02-21 DIAGNOSIS — Z1159 Encounter for screening for other viral diseases: Secondary | ICD-10-CM

## 2021-03-06 ENCOUNTER — Encounter: Payer: Self-pay | Admitting: Certified Nurse Midwife

## 2021-03-08 ENCOUNTER — Other Ambulatory Visit: Payer: Self-pay

## 2021-03-08 ENCOUNTER — Encounter: Payer: Self-pay | Admitting: Certified Nurse Midwife

## 2021-03-08 ENCOUNTER — Ambulatory Visit (INDEPENDENT_AMBULATORY_CARE_PROVIDER_SITE_OTHER): Payer: BC Managed Care – PPO | Admitting: Certified Nurse Midwife

## 2021-03-08 DIAGNOSIS — R202 Paresthesia of skin: Secondary | ICD-10-CM

## 2021-03-08 DIAGNOSIS — G5601 Carpal tunnel syndrome, right upper limb: Secondary | ICD-10-CM

## 2021-03-08 DIAGNOSIS — R200 Anesthesia of skin: Secondary | ICD-10-CM

## 2021-03-08 DIAGNOSIS — Z789 Other specified health status: Secondary | ICD-10-CM

## 2021-03-08 DIAGNOSIS — Z1331 Encounter for screening for depression: Secondary | ICD-10-CM

## 2021-03-08 NOTE — Progress Notes (Signed)
Subjective:    Vanessa Mullins is a 25 y.o. G59P2002 Caucasian female who presents for a postpartum visit. She is 9 weeks postpartum following a repeat cesarean section, low transverse incision for fetal malpresentation at [redacted]w[redacted]d gestation. Anesthesia: spinal. I have fully reviewed the prenatal and intrapartum course.   Postpartum course has been uneventful. Baby's course has been uneventful.   Baby is feeding by pumping and bottle-feeding breast milk; breastfeeding at night.  Bleeding started period 03/08/21.   Bowel function is normal. Bladder function is normal.   Patient is not sexually active.   Contraception method is none, considering hormonal IUD, Mirena  Postpartum depression screening: negative. Score PH-Q 7.    Last pap 07/09/2020 and was Neg/Neg.  Patient reports mild carpel tunnel pain/soreness to right wrist.  Right leg spasms and then goes numb for a couple seconds and at times has hip pain has continued since pregnancy.  Denies difficulty breathing or respiratory distress, chest pain, excessive vaginal bleeding, breast pain or discomfort, or leg pain or swelling.   The following portions of the patient's history were reviewed and updated as appropriate: allergies, current medications, past medical history, past surgical history and problem list.  Review of Systems Pertinent items are noted in HPI.   Objective:   BP 116/75   Pulse 80   Resp 16   Ht 5\' 5"  (1.651 m)   Wt 220 lb 3.2 oz (99.9 kg)   LMP 03/08/2021 (Exact Date)   Breastfeeding Yes   BMI 36.64 kg/m   General:  alert, cooperative and no distress   Breasts:  deferred, no complaints  Lungs: clear to auscultation bilaterally  Heart:  regular rate and rhythm  Abdomen: soft, nontender,  small redden area noted to incision site   Corpus: Well-involuted  Adnexa:  Non-palpable         Depression screen [facility] 2/9 03/08/2021 12/21/2020  Decreased Interest 1 0  Down, Depressed, Hopeless 1 0  PHQ - 2  Score 2 0  Altered sleeping 0 -  Tired, decreased energy 3 -  Change in appetite 1 -  Feeling bad or failure about yourself  0 -  Trouble concentrating 0 -  Moving slowly or fidgety/restless 1 -  Suicidal thoughts 0 -  PHQ-9 Score 7 -   GAD 7 : Generalized Anxiety Score 03/08/2021  Nervous, Anxious, on Edge 2  Control/stop worrying 2  Worry too much - different things 2  Trouble relaxing 2  Restless 2  Easily annoyed or irritable 2  Afraid - awful might happen 2  Total GAD 7 Score 14    Assessment:   Postpartum exam  9 wks s/p Cesarean section  Breastfeeding   Depression screening  Contraception counseling   Carpel tunnel in right wrist  Numbness and tingling in right leg  Plan:  Contraception: Discussed birth control options in detail and considering IUD (Mirena); will contact office if desired.   Discussed home treatment measures, stretches and wearing hand support brace, patient verbalized understanding.  Encouraged placing a thin pad or white wash cloth to incision to allow incision to completely heal.  Reviewed red flag symptoms and when to call.  RTC for IUD placement with JML or sooner if needed.  03/10/2021, Student[employer] 03/08/21 9:12 AM

## 2021-03-08 NOTE — Patient Instructions (Addendum)
Hand Pain Many things can cause hand pain. Some common causes are:  An injury.  Repeating the same movement with your hand over and over (overuse).  Osteoporosis.  Arthritis.  Lumps in the tendons or joints of the hand and wrist (ganglion cysts).  Nerve compression syndromes (carpal tunnel syndrome).  Inflammation of the tendons (tendinitis).  Infection. Follow these instructions at home: Pay attention to any changes in your symptoms. Take these actions to help with your discomfort: Managing pain, stiffness, and swelling  Take over-the-counter and prescription medicines only as told by your health care provider.  Wear a hand splint or support as told by your health care provider.  If directed, put ice on the affected area: ? Put ice in a plastic bag. ? Place a towel between your skin and the bag. ? Leave the ice on for 20 minutes, 2-3 times a day.   Activity  Take breaks from repetitive activity often.  Avoid activities that make your pain worse.  Minimize stress on your hands and wrists as much as possible.  Do stretches or exercises as told by your health care provider.  Do not do activities that make your pain worse. Contact a health care provider if:  Your pain does not get better after a few days of self-care.  Your pain gets worse.  Your pain affects your ability to do your daily activities. Get help right away if:  Your hand becomes warm, red, or swollen.  Your hand is numb or tingling.  Your hand is extremely swollen or deformed.  Your hand or fingers turn white or blue.  You cannot move your hand, wrist, or fingers. Summary  Many things can cause hand pain.  Contact your health care provider if your pain does not get better after a few days of self care.  Minimize stress on your hands and wrists as much as possible.  Do not do activities that make your pain worse. This information is not intended to replace advice given to you by your  health care provider. Make sure you discuss any questions you have with your health care provider. Document Revised: 07/23/2018 Document Reviewed: 07/23/2018 Elsevier Patient Education  2021 Elsevier Inc.   Levonorgestrel intrauterine device (IUD) What is this medicine? LEVONORGESTREL IUD (LEE voe nor jes trel) is a contraceptive (birth control) device. The device is placed inside the uterus by a health care provider. It is used to prevent pregnancy. Some devices can also be used to treat heavy bleeding that occurs during your period. This medicine may be used for other purposes; ask your health care provider or pharmacist if you have questions. COMMON BRAND NAME(S): Cameron Ali What should I tell my health care provider before I take this medicine? They need to know if you have any of these conditions:  abnormal Pap smear  cancer of the breast, uterus, or cervix  diabetes  endometritis  genital or pelvic infection now or in the past  have more than one sexual partner or your partner has more than one partner  heart disease  history of an ectopic or tubal pregnancy  immune system problems  IUD in place  liver disease or tumor  problems with blood clots or take blood-thinners  seizures  use intravenous drugs  uterus of unusual shape  vaginal bleeding that has not been explained  an unusual or allergic reaction to levonorgestrel, other hormones, silicone, or polyethylene, medicines, foods, dyes, or preservatives  pregnant or trying to  get pregnant  breast-feeding How should I use this medicine? This device is placed inside the uterus by a health care professional. Talk to your pediatrician regarding the use of this medicine in children. Special care may be needed. Overdosage: If you think you have taken too much of this medicine contact a poison control center or emergency room at once. NOTE: This medicine is only for you. Do not share this  medicine with others. What if I miss a dose? This does not apply. Depending on the brand of device you have inserted, the device will need to be replaced every 3 to 7 years if you wish to continue using this type of birth control. What may interact with this medicine? Do not take this medicine with any of the following medications:  amprenavir  bosentan  fosamprenavir This medicine may also interact with the following medications:  aprepitant  armodafinil  barbiturate medicines for inducing sleep or treating seizures  bexarotene  boceprevir  griseofulvin  medicines to treat seizures like carbamazepine, ethotoin, felbamate, oxcarbazepine, phenytoin, topiramate  modafinil  pioglitazone  rifabutin  rifampin  rifapentine  some medicines to treat HIV infection like atazanavir, efavirenz, indinavir, lopinavir, nelfinavir, tipranavir, ritonavir  St. John's wort  warfarin This list may not describe all possible interactions. Give your health care provider a list of all the medicines, herbs, non-prescription drugs, or dietary supplements you use. Also tell them if you smoke, drink alcohol, or use illegal drugs. Some items may interact with your medicine. What should I watch for while using this medicine? Visit your doctor or health care professional for regular check ups. See your doctor if you or your partner has sexual contact with others, becomes HIV positive, or gets a sexual transmitted disease. This product does not protect you against HIV infection (AIDS) or other sexually transmitted diseases. You can check the placement of the IUD yourself by reaching up to the top of your vagina with clean fingers to feel the threads. Do not pull on the threads. It is a good habit to check placement after each menstrual period. Call your doctor right away if you feel more of the IUD than just the threads or if you cannot feel the threads at all. The IUD may come out by itself. You may  become pregnant if the device comes out. If you notice that the IUD has come out use a backup birth control method like condoms and call your health care provider. Using tampons will not change the position of the IUD and are okay to use during your period. This IUD can be safely scanned with magnetic resonance imaging (MRI) only under specific conditions. Before you have an MRI, tell your healthcare provider that you have an IUD in place, and which type of IUD you have in place. What side effects may I notice from receiving this medicine? Side effects that you should report to your doctor or health care professional as soon as possible:  allergic reactions like skin rash, itching or hives, swelling of the face, lips, or tongue  fever, flu-like symptoms  genital sores  high blood pressure  no menstrual period for 6 weeks during use  pain, swelling, warmth in the leg  pelvic pain or tenderness  severe or sudden headache  signs of pregnancy  stomach cramping  sudden shortness of breath  trouble with balance, talking, or walking  unusual vaginal bleeding, discharge  yellowing of the eyes or skin Side effects that usually do not require medical  attention (report to your doctor or health care professional if they continue or are bothersome):  acne  breast pain  change in sex drive or performance  changes in weight  cramping, dizziness, or faintness while the device is being inserted  headache  irregular menstrual bleeding within first 3 to 6 months of use  nausea This list may not describe all possible side effects. Call your doctor for medical advice about side effects. You may report side effects to FDA at 1-800-FDA-1088. Where should I keep my medicine? This does not apply. NOTE: This sheet is a summary. It may not cover all possible information. If you have questions about this medicine, talk to your doctor, pharmacist, or health care provider.  2021  Elsevier/Gold Standard (2020-06-26 16:27:45)

## 2021-03-08 NOTE — Progress Notes (Signed)
I have seen, interviewed, and examined the patient in conjunction with the [facility] [employer] and affirm the diagnosis and management plan.   Gunnar Bulla, CNM [facility] 03/08/21 4:51 PM

## 2021-07-24 ENCOUNTER — Ambulatory Visit: Payer: Self-pay | Admitting: Internal Medicine

## 2021-09-10 NOTE — Progress Notes (Deleted)
NEW PATIENT Date of Service/Encounter:  09/10/21 Referring provider: Kandyce Rud, MD Primary care provider: Kandyce Rud, MD  Subjective:  Vanessa Mullins is a 25 y.o. female with a PMHx of *** presenting today for evaluation of concern for allergies and shellfish allergy History obtained from: chart review and {Persons; PED relatives w/patient:19415::"patient"}.   Amoxicillin allergy:   Shellfish allergy:   Recent diagnosis of otitis media with effusion bilaterally on 07/24/21 treated with flonase.   Other allergy screening: Asthma: {Blank single:19197::"yes","no"} Rhino conjunctivitis: {Blank single:19197::"yes","no"} Food allergy: {Blank single:19197::"yes","no"} Medication allergy: {Blank single:19197::"yes","no"} Hymenoptera allergy: {Blank single:19197::"yes","no"} Urticaria: {Blank single:19197::"yes","no"} Eczema:{Blank single:19197::"yes","no"} History of recurrent infections suggestive of immunodeficency: {Blank single:19197::"yes","no"} ***Vaccinations are up to date.   Past Medical History: Past Medical History:  Diagnosis Date   Asthma    COVID-19 12/21/2020   Inguinal hernia    Medication List:  Current Outpatient Medications  Medication Sig Dispense Refill   acetaminophen (TYLENOL) 500 MG tablet Take 2 tablets (1,000 mg total) by mouth every 6 (six) hours as needed. (Patient not taking: Reported on 03/08/2021) 30 tablet 0   ibuprofen (ADVIL) 800 MG tablet Take 1 tablet (800 mg total) by mouth every 8 (eight) hours. (Patient not taking: Reported on 03/08/2021) 30 tablet 0   Prenatal Vit-Fe Fumarate-FA (MULTIVITAMIN-PRENATAL) 27-0.8 MG TABS tablet Take 1 tablet by mouth daily at 12 noon.     No current facility-administered medications for this visit.   Known Allergies:  Allergies  Allergen Reactions   Amoxicillin Anaphylaxis and Rash   Other Other (See Comments)    Numbness in fingers   Shellfish Allergy     Fingers tingle, edema   Past  Surgical History: Past Surgical History:  Procedure Laterality Date   CESAREAN SECTION     CESAREAN SECTION N/A 01/08/2021   Procedure: CESAREAN SECTION;  Surgeon: Linzie Collin, MD;  Location: [facility] ORS;  Service: Obstetrics;  Laterality: N/A;   FRACTURE SURGERY     sedated for fracture armed setting   TONSILLECTOMY     WISDOM TOOTH EXTRACTION     Family History: Family History  Problem Relation Age of Onset   Hypertension Mother    Hypertension Father    Diabetes Maternal Grandfather    Cancer Paternal Grandmother        lung cancer   Social History: Vanessa Mullins lives ***.   ROS:  All other systems negative except as noted per HPI.  Objective:  currently breastfeeding. There is no height or weight on file to calculate BMI. Physical Exam:  General Appearance:  Alert, cooperative, no distress, appears stated age  Head:  Normocephalic, without obvious abnormality, atraumatic  Eyes:  Conjunctiva clear, EOM's intact  Nose: Nares normal  Throat: Lips, tongue normal; teeth and gums normal  Neck: Supple, symmetrical  Lungs:   Respirations unlabored, no coughing  Heart:  Appears well perfused  Extremities: No edema  Skin: Skin color, texture, turgor normal, no rashes or lesions on visualized portions of skin  Neurologic: No gross deficits   Diagnostics: Spirometry:  Tracings reviewed. Her effort: {Blank single:19197::"Good reproducible efforts.","It was hard to get consistent efforts and there is a question as to whether this reflects a maximal maneuver.","Poor effort, data can not be interpreted."} FVC: ***L FEV1: ***L, ***% predicted FEV1/FVC ratio: ***% Interpretation: {Blank single:19197::"Spirometry consistent with mild obstructive disease","Spirometry consistent with moderate obstructive disease","Spirometry consistent with severe obstructive disease","Spirometry consistent with possible restrictive disease","Spirometry consistent with mixed obstructive and restrictive  disease","Spirometry uninterpretable due to [employer]  consistent with normal pattern","No overt abnormalities noted given today's efforts"}.  Please see scanned spirometry results for details.  Skin Testing: {Blank single:19197::"Select foods","Environmental allergy panel","Environmental allergy panel and select foods","Food allergy panel","None","Deferred due to recent antihistamines use"}. Positive test to: ***. Negative test to: ***.  Results discussed with patient/family.   {Blank single:19197::"Allergy testing results were read and interpreted by myself, documented by clinical staff."," "}  Assessment and Plan  There are no Patient Instructions on file for this visit.  No follow-ups on file.  {Blank single:19197::"This note in its entirety was forwarded to the Provider who requested this consultation."}  Thank you for your kind referral. I appreciate the opportunity to take part in Vanessa Mullins's care. Please do not hesitate to contact me with questions.***  Sincerely,  Tonny Bollman, MD [facility]

## 2021-09-11 ENCOUNTER — Ambulatory Visit: Payer: Self-pay | Admitting: Internal Medicine

## 2022-12-03 ENCOUNTER — Ambulatory Visit (INDEPENDENT_AMBULATORY_CARE_PROVIDER_SITE_OTHER): Payer: BC Managed Care – PPO | Admitting: Obstetrics and Gynecology

## 2022-12-03 ENCOUNTER — Encounter: Payer: Self-pay | Admitting: Obstetrics and Gynecology

## 2022-12-03 VITALS — BP 121/85 | HR 79 | Ht 65.0 in | Wt 239.0 lb

## 2022-12-03 DIAGNOSIS — Z3009 Encounter for other general counseling and advice on contraception: Secondary | ICD-10-CM

## 2022-12-03 DIAGNOSIS — Z30430 Encounter for insertion of intrauterine contraceptive device: Secondary | ICD-10-CM

## 2022-12-03 DIAGNOSIS — Z3202 Encounter for pregnancy test, result negative: Secondary | ICD-10-CM | POA: Diagnosis not present

## 2022-12-03 LAB — POCT URINE PREGNANCY: Preg Test, Ur: NEGATIVE

## 2022-12-03 MED ORDER — LEVONORGESTREL 20 MCG/DAY IU IUD
1.0000 | INTRAUTERINE_SYSTEM | Freq: Once | INTRAUTERINE | Status: AC
  Administered 2022-12-03: 1 via INTRAUTERINE

## 2022-12-03 NOTE — Progress Notes (Signed)
Patient presents today to discuss contraceptive options. She states she has not been on anything for birth control for the past 3 years and would like to get an IUD. She has had a mirena before and had a good experience with it. No additional concerns.

## 2022-12-03 NOTE — Progress Notes (Signed)
HPI:      [name] is a 27 y.o. [id number] who LMP was Patient's last menstrual period was 11/26/2022 (exact date).  Subjective:   She presents today to discuss birth control methods and after that discussion she has decided she would like a Mirena IUD inserted.      Hx: The following portions of the patient's history were reviewed and updated as appropriate:             She  has a past medical history of Asthma, COVID-19 (12/21/2020), and Inguinal hernia. She does not have any pertinent problems on file. She  has a past surgical history that includes Cesarean section; Tonsillectomy; Wisdom tooth extraction; Fracture surgery; and Cesarean section (N/A, 01/08/2021). Her family history includes Cancer in her paternal grandmother; Diabetes in her maternal grandfather; Hypertension in her father and mother. She  reports that she has never smoked. She has never used smokeless tobacco. She reports that she does not drink alcohol and does not use drugs. She currently has no medications in their medication list. She is allergic to amoxicillin, other, and shellfish allergy.       Review of Systems:  Review of Systems  Constitutional: Denied constitutional symptoms, night sweats, recent illness, fatigue, fever, insomnia and weight loss.  Eyes: Denied eye symptoms, eye pain, photophobia, vision change and visual disturbance.  Ears/Nose/Throat/Neck: Denied ear, nose, throat or neck symptoms, hearing loss, nasal discharge, sinus congestion and sore throat.  Cardiovascular: Denied cardiovascular symptoms, arrhythmia, chest pain/pressure, edema, exercise intolerance, orthopnea and palpitations.  Respiratory: Denied pulmonary symptoms, asthma, pleuritic pain, productive sputum, cough, dyspnea and wheezing.  Gastrointestinal: Denied, gastro-esophageal reflux, melena, nausea and vomiting.  Genitourinary: Denied genitourinary symptoms including symptomatic vaginal discharge, pelvic relaxation  issues, and urinary complaints.  Musculoskeletal: Denied musculoskeletal symptoms, stiffness, swelling, muscle weakness and myalgia.  Dermatologic: Denied dermatology symptoms, rash and scar.  Neurologic: Denied neurology symptoms, dizziness, headache, neck pain and syncope.  Psychiatric: Denied psychiatric symptoms, anxiety and depression.  Endocrine: Denied endocrine symptoms including hot flashes and night sweats.   Meds:   No current outpatient medications on file prior to visit.   No current facility-administered medications on file prior to visit.    Objective:     Vitals:   12/03/22 1357  BP: 121/85  Pulse: 79    Physical examination   Pelvic:   Vulva: Normal appearance.  No lesions.  Vagina: No lesions or abnormalities noted.  Support: Normal pelvic support.  Urethra No masses tenderness or scarring.  Meatus Normal size without lesions or prolapse.  Cervix: Normal appearance.  No lesions.  Anus: Normal exam.  No lesions.  Perineum: Normal exam.  No lesions.        Bimanual   Uterus: Normal size.  Non-tender.  Mobile.  AV.  Adnexae: No masses.  Non-tender to palpation.  Cul-de-sac: Negative for abnormality.   IUD Procedure Pt has read the booklet and signed the appropriate forms regarding the Mirena IUD.  All of her questions have been answered.   The cervix was cleansed with betadine solution.  After sounding the uterus and noting the position, the IUD was placed in the usual manner without problem.  The string was cut to the appropriate length.  The patient tolerated the procedure well.            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 Encounter for insertion of mirena IUD   2. Encounter for counseling regarding  contraception     Counseled on multiple forms of contraception-patient has decided upon IUD and this was placed today.  Plan:             F/U  Return in about 4 weeks (around 12/31/2022) for For IUD f/u. I spent 18 minutes involved in the care of this patient preparing to see the patient by obtaining and reviewing her medical history (including labs, imaging tests and prior procedures), documenting clinical information in the electronic health record (EHR), counseling and coordinating care plans, writing and sending prescriptions, ordering tests or procedures and in direct communicating with the patient and medical staff discussing pertinent items from her history and physical exam.  Finis Bud, M.D. 12/03/2022 2:47 PM

## 2022-12-04 ENCOUNTER — Encounter: Payer: Self-pay | Admitting: Obstetrics and Gynecology

## 2023-01-01 ENCOUNTER — Ambulatory Visit: Payer: BC Managed Care – PPO | Admitting: Obstetrics and Gynecology

## 2023-01-01 DIAGNOSIS — Z30431 Encounter for routine checking of intrauterine contraceptive device: Secondary | ICD-10-CM

## 2023-03-02 ENCOUNTER — Emergency Department: Payer: BC Managed Care – PPO

## 2023-03-02 ENCOUNTER — Other Ambulatory Visit: Payer: Self-pay

## 2023-03-02 ENCOUNTER — Emergency Department
Admission: EM | Admit: 2023-03-02 | Discharge: 2023-03-02 | Disposition: A | Discharge status: Home / Self Care | Payer: BC Managed Care – PPO | Attending: Emergency Medicine | Admitting: Emergency Medicine

## 2023-03-02 DIAGNOSIS — D259 Leiomyoma of uterus, unspecified: Secondary | ICD-10-CM

## 2023-03-02 DIAGNOSIS — N939 Abnormal uterine and vaginal bleeding, unspecified: Secondary | ICD-10-CM

## 2023-03-02 LAB — COMPREHENSIVE METABOLIC PANEL
ALT: 25 U/L (ref 0–44)
AST: 27 U/L (ref 15–41)
Albumin: 4.1 g/dL (ref 3.5–5.0)
Alkaline Phosphatase: 63 U/L (ref 38–126)
Anion gap: 9 (ref 5–15)
BUN: 13 mg/dL (ref 6–20)
CO2: 23 mmol/L (ref 22–32)
Calcium: 8.5 mg/dL — ABNORMAL LOW (ref 8.9–10.3)
Chloride: 105 mmol/L (ref 98–111)
Creatinine, Ser: 0.7 mg/dL (ref 0.44–1.00)
GFR, Estimated: >60 mL/min (ref >60)
Glucose, Bld: 117 mg/dL — ABNORMAL HIGH (ref 70–99)
Potassium: 3.4 mmol/L — ABNORMAL LOW (ref 3.5–5.1)
Sodium: 137 mmol/L (ref 135–145)
Total Bilirubin: 0.5 mg/dL (ref 0.3–1.2)
Total Protein: 7.7 g/dL (ref 6.5–8.1)

## 2023-03-02 LAB — URINALYSIS, ROUTINE W REFLEX MICROSCOPIC
Bilirubin Urine: NEGATIVE
Glucose, UA: NEGATIVE mg/dL
Hgb urine dipstick: NEGATIVE
Ketones, ur: NEGATIVE mg/dL
Leukocytes,Ua: NEGATIVE
Nitrite: NEGATIVE
Protein, ur: NEGATIVE mg/dL
Specific Gravity, Urine: 1.027 (ref 1.005–1.030)
pH: 5 (ref 5.0–8.0)

## 2023-03-02 LAB — CBC
HCT: 39.8 % (ref 36.0–46.0)
Hemoglobin: 13.5 g/dL (ref 12.0–15.0)
MCH: 27.4 pg (ref 26.0–34.0)
MCHC: 33.9 g/dL (ref 30.0–36.0)
MCV: 80.9 fL (ref 80.0–100.0)
Platelets: 315 10*3/uL (ref 150–400)
RBC: 4.92 MIL/uL (ref 3.87–5.11)
RDW: 12.6 % (ref 11.5–15.5)
WBC: 8.8 10*3/uL (ref 4.0–10.5)
nRBC: 0 % (ref 0.0–0.2)

## 2023-03-02 LAB — LIPASE, BLOOD: Lipase: 34 U/L (ref 11–51)

## 2023-03-02 LAB — POC URINE PREG, ED: Preg Test, Ur: NEGATIVE

## 2023-03-02 MED ORDER — ONDANSETRON 4 MG PO TBDP
4.0000 mg | ORAL_TABLET | Freq: Once | ORAL | Status: AC | PRN
  Administered 2023-03-02: 4 mg via ORAL
  Filled 2023-03-02: qty 1

## 2023-03-02 NOTE — ED Triage Notes (Signed)
Pt reports having Morena IUD placed 8 months ago and being able to feel the strings until recently. OB instructed patient to come in and have it checked if she had any cramping or blood blots. Pt reports having abdominal cramping beginning earlier today and passing a blood clot the size of a quarter. No bleeding since. Pt reports cramping worsening on mid right side and lower left side of abdomen. Denies fever. Reports mild SOB today. No acute distress noted in triage.

## 2023-03-02 NOTE — Discharge Instructions (Addendum)
You may take Tylenol and/or Ibuprofen as needed for cramps.  Return to the ER for worsening symptoms, persistent vomiting, difficulty breathing or other concerns.

## 2023-03-02 NOTE — ED Provider Notes (Incomplete)
-----------------------------------------   11:35 PM on 03/02/2023 -----------------------------------------   Pelvic ultrasound and interpreted by Dr. Kearney Hard:  IUD in expected position within the endometrium.    Small left uterine fibroid.    No acute findings.   Ultrasound demonstrates IUD in proper position.  Patient will follow-up with GYN.  Strict return precautions given.  Patient verbalizes understanding and agrees with plan of care.

## 2023-03-02 NOTE — ED Notes (Signed)
Reviewed pt's cc with Dr Fanny Bien, no further orders given at this time

## 2023-03-02 NOTE — ED Provider Notes (Signed)
-----------------------------------------   11:35 PM on 03/02/2023 -----------------------------------------   Pelvic ultrasound and interpreted by Dr. Kearney Hard:  IUD in expected position within the endometrium.    Small left uterine fibroid.    No acute findings.   Ultrasound demonstrates IUD in proper position.  Patient will follow-up with GYN.  Strict return precautions given.  Patient verbalizes understanding and agrees with plan of care.   Irean Hong, MD 03/03/23 [phone]

## 2023-03-02 NOTE — ED Triage Notes (Signed)
Pt reports intermittent nausea, no vomiting. 3 episodes of diarrhea today.

## 2023-03-02 NOTE — ED Triage Notes (Signed)
Pt also reports skin itching and spots on skin across body today.

## 2023-03-02 NOTE — ED Provider Notes (Signed)
[facility] Provider Note    Event Date/Time   First MD Initiated Contact with Patient 03/02/23 2042     (approximate)   History   Chief complaint: Vaginal spotting  HPI  Mercie Balsley is a 27 y.o. female with no significant past medical history who had an IUD placed about 3 months ago, but over the past week has been unable to locate the strings.  She is worried that its become displaced.  Also has noticed a small amount of pelvic cramping earlier today and some vaginal spotting.  No discharge.  No abdominal pain, chest pain, shortness of breath, fever     Physical Exam   Triage Vital Signs: ED Triage Vitals  Enc Vitals Group     BP 03/02/23 1953 (!) 136/101     Pulse Rate 03/02/23 1953 76     Resp 03/02/23 1953 16     Temp 03/02/23 1953 98 F (36.7 C)     Temp Source 03/02/23 1953 Oral     SpO2 03/02/23 1953 97 %     Weight 03/02/23 1954 236 lb (107 kg)     Height 03/02/23 1954  (1.651 m)     Head Circumference --      Peak Flow --      Pain Score 03/02/23 1954 5     Pain Loc --      Pain Edu? --      Excl. in GC? --     Most recent vital signs: Vitals:   03/02/23 1953  BP: (!) 136/101  Pulse: 76  Resp: 16  Temp: 98 F (36.7 C)  SpO2: 97%    General: Awake, no distress.  CV:  Good peripheral perfusion.  Resp:  Normal effort.  Abd:  No distention.  Soft nontender Other:  Calm and nontoxic   ED Results / Procedures / Treatments   Labs (all labs ordered are listed, but only abnormal results are displayed) Labs Reviewed  COMPREHENSIVE METABOLIC PANEL - Abnormal; Notable for the following components:      Result Value   Potassium 3.4 (*)    Glucose, Bld 117 (*)    Calcium 8.5 (*)    All other components within normal limits  URINALYSIS, ROUTINE W REFLEX MICROSCOPIC - Abnormal; Notable for the following components:   Color, Urine YELLOW (*)    APPearance HAZY (*)    All other components within normal limits   LIPASE, BLOOD  CBC  POC URINE PREG, ED     RADIOLOGY X-ray pelvis interpreted by me, shows IUD present in a transverse orientation.  Radiology report reviewed  Ultrasound pelvis pending   PROCEDURES:  Procedures   MEDICATIONS ORDERED IN ED: Medications  ondansetron (ZOFRAN-ODT) disintegrating tablet 4 mg (4 mg Oral Given 03/02/23 2000)     IMPRESSION / MDM / ASSESSMENT AND PLAN / ED COURSE  I reviewed the triage vital signs and the nursing notes.                              Differential diagnosis includes, but is not limited to, expelled IUD, migrated IUD.  Doubt STI PID TOA torsion appendicitis or cystitis.  Patient presents with vaginal spotting and cramping, possibility of expelled IUD and starting a menstrual cycle.  Pregnancy test is negative and labs are normal, she is nontoxic with benign abdominal exam.  X-ray does show that the IUD is present so  we will obtain an ultrasound of the pelvis to evaluate for complication.       FINAL CLINICAL IMPRESSION(S) / ED DIAGNOSES   Final diagnoses:  Vaginal spotting     Rx / DC Orders   ED Discharge Orders     None        Note:  This document was prepared using Dragon voice recognition software and may include unintentional dictation errors.   Sharman Cheek, MD 03/02/23 2151

## 2023-03-13 ENCOUNTER — Ambulatory Visit: Payer: BC Managed Care – PPO | Admitting: Licensed Practical Nurse

## 2025-02-20 ENCOUNTER — Ambulatory Visit: Admitting: Family Medicine
